# Patient Record
Sex: Female | Born: 1995 | Race: White | Hispanic: No | Marital: Single
Health system: Western US, Academic
[De-identification: ages and names within clinical notes are randomized; demographics above are authoritative.]

## PROBLEM LIST (undated history)

## (undated) DIAGNOSIS — N200 Calculus of kidney: Secondary | ICD-10-CM

## (undated) HISTORY — PX: SPINAL FUSION: SHX223

## (undated) HISTORY — PX: BREAST REDUCTION SURGERY: SHX8

---

## 2016-10-29 ENCOUNTER — Emergency Department
Admission: EM | Admit: 2016-10-29 | Discharge: 2016-10-29 | Disposition: A | Discharge status: Home / Self Care | Payer: BLUE CROSS/BLUE SHIELD | Attending: Emergency Medicine | Admitting: Emergency Medicine

## 2016-10-29 ENCOUNTER — Emergency Department: Payer: BLUE CROSS/BLUE SHIELD

## 2016-10-29 ENCOUNTER — Encounter: Payer: Self-pay | Admitting: Emergency Medicine

## 2016-10-29 DIAGNOSIS — R1032 Left lower quadrant pain: Secondary | ICD-10-CM | POA: Insufficient documentation

## 2016-10-29 DIAGNOSIS — R109 Unspecified abdominal pain: Secondary | ICD-10-CM

## 2016-10-29 HISTORY — DX: Calculus of kidney: N20.0

## 2016-10-29 LAB — BASIC METABOLIC PANEL
ANION GAP: 8 (ref 5–15)
BUN: 8 mg/dL (ref 6–20)
CALCIUM: 9.1 mg/dL (ref 8.9–10.3)
CO2: 25 mmol/L (ref 22–32)
Chloride: 105 mmol/L (ref 101–111)
Creatinine, Ser: 0.36 mg/dL — ABNORMAL LOW (ref 0.44–1.00)
GLUCOSE: 100 mg/dL — AB (ref 65–99)
POTASSIUM: 3.9 mmol/L (ref 3.5–5.1)
Sodium: 138 mmol/L (ref 135–145)

## 2016-10-29 LAB — URINALYSIS, COMPLETE (UACMP) WITH MICROSCOPIC
BACTERIA UA: NONE SEEN
BILIRUBIN URINE: NEGATIVE
Glucose, UA: NEGATIVE mg/dL
Hgb urine dipstick: NEGATIVE
KETONES UR: 5 mg/dL — AB
LEUKOCYTES UA: NEGATIVE
Nitrite: NEGATIVE
PROTEIN: NEGATIVE mg/dL
Specific Gravity, Urine: 1.017 (ref 1.005–1.030)
pH: 6 (ref 5.0–8.0)

## 2016-10-29 LAB — CBC
HEMATOCRIT: 39.9 % (ref 35.0–47.0)
Hemoglobin: 13.1 g/dL (ref 12.0–16.0)
MCH: 26.1 pg (ref 26.0–34.0)
MCHC: 32.7 g/dL (ref 32.0–36.0)
MCV: 79.8 fL — AB (ref 80.0–100.0)
PLATELETS: 372 10*3/uL (ref 150–440)
RBC: 5 MIL/uL (ref 3.80–5.20)
RDW: 14.7 % — AB (ref 11.5–14.5)
WBC: 11.2 10*3/uL — AB (ref 3.6–11.0)

## 2016-10-29 LAB — POCT PREGNANCY, URINE: Preg Test, Ur: NEGATIVE

## 2016-10-29 MED ORDER — DICYCLOMINE HCL 20 MG PO TABS: 20.0000 mg | ORAL_TABLET | Freq: Three times a day (TID) | ORAL | 0 refills | Status: AC | PRN

## 2016-10-29 MED ORDER — TRAMADOL HCL 50 MG PO TABS: 50.0000 mg | ORAL_TABLET | Freq: Four times a day (QID) | ORAL | 0 refills | Status: AC | PRN

## 2016-10-29 MED ORDER — OXYCODONE-ACETAMINOPHEN 5-325 MG PO TABS
2.0000 | ORAL_TABLET | Freq: Once | ORAL | Status: AC
  Administered 2016-10-29: 2 via ORAL

## 2016-10-29 MED ORDER — OXYCODONE-ACETAMINOPHEN 5-325 MG PO TABS
ORAL_TABLET | ORAL | Status: AC
  Administered 2016-10-29: 2 via ORAL
  Filled 2016-10-29: qty 2

## 2016-10-29 NOTE — ED Notes (Signed)
ED Provider at bedside. 

## 2016-10-29 NOTE — ED Provider Notes (Signed)
[facility] Emergency Department Provider Note       Time seen: ----------------------------------------- 8:17 PM on 10/29/2016 -----------------------------------------     I have reviewed the triage vital signs and the nursing notes.   HISTORY   Chief Complaint Back Pain    HPI Brittney Parks is a 21 y.o. female who presents to the ED for low back pain since Tuesday night. Patient is had left lower abdominal pain but has also had some pain in the right flank. Patient reports she has a history of kidney stones, denies fevers, chills, chest pain, shortness of breath, vomiting or diarrhea.   Past Medical History:  Diagnosis Date  . Kidney stones     There are no active problems to display for this patient.   Past Surgical History:  Procedure Laterality Date  . BREAST REDUCTION SURGERY    . SPINAL FUSION      Allergies Patient has no known allergies.  Social History Social History  Substance Use Topics  . Smoking status: Never Smoker  . Smokeless tobacco: Never Used  . Alcohol use No    Review of Systems Constitutional: Negative for fever. Cardiovascular: Negative for chest pain. Respiratory: Negative for shortness of breath. Gastrointestinal: Positive for flank pain Genitourinary: Negative for dysuria. Musculoskeletal: Negative for back pain. Skin: Negative for rash. Neurological: Negative for headaches, focal weakness or numbness.  10-point ROS otherwise negative.  ____________________________________________   PHYSICAL EXAM:  VITAL SIGNS: ED Triage Vitals [10/29/16 1915]  Enc Vitals Group     BP (!) 155/100     Pulse Rate 78     Resp 18     Temp 97.8 F (36.6 C)     Temp Source Oral     SpO2 99 %     Weight 180 lb (81.6 kg)     Height  (1.6 m)     Head Circumference      Peak Flow      Pain Score 5     Pain Loc      Pain Edu?      Excl. in GC?     Constitutional: Alert and oriented. Well appearing  and in no distress. Eyes: Conjunctivae are normal. PERRL. Normal extraocular movements. ENT   Head: Normocephalic and atraumatic.   Nose: No congestion/rhinnorhea.   Mouth/Throat: Mucous membranes are moist.   Neck: No stridor. Cardiovascular: Normal rate, regular rhythm. No murmurs, rubs, or gallops. Respiratory: Normal respiratory effort without tachypnea nor retractions. Breath sounds are clear and equal bilaterally. No wheezes/rales/rhonchi. Gastrointestinal: Soft and nontender. Normal bowel sounds Musculoskeletal: Nontender with normal range of motion in extremities. No lower extremity tenderness nor edema. Neurologic:  Normal speech and language. No gross focal neurologic deficits are appreciated.  Skin:  Skin is warm, dry and intact. No rash noted. Psychiatric: Mood and affect are normal. Speech and behavior are normal.  ____________________________________________  ED COURSE:  Pertinent labs & imaging results that were available during my care of the patient were reviewed by me and considered in my medical decision making (see chart for details). Patient presents for flank pain, we will assess with labs and imaging as indicated.   Procedures ____________________________________________   LABS (pertinent positives/negatives)  Labs Reviewed  URINALYSIS, COMPLETE (UACMP) WITH MICROSCOPIC - Abnormal; Notable for the following:       Result Value   Color, Urine YELLOW (*)    APPearance HAZY (*)    Ketones, ur 5 (*)    Squamous Epithelial /  LPF 0-5 (*)    All other components within normal limits  CBC - Abnormal; Notable for the following:    WBC 11.2 (*)    MCV 79.8 (*)    RDW 14.7 (*)    All other components within normal limits  BASIC METABOLIC PANEL - Abnormal; Notable for the following:    Glucose, Bld 100 (*)    Creatinine, Ser 0.36 (*)    All other components within normal limits  POCT PREGNANCY, URINE    RADIOLOGY Images were viewed by  me  KUB  IMPRESSION: 1. Nonobstructed gas pattern 2. Questionable punctate stones over the right kidney. IMPRESSION: 1. Bilateral nephrolithiasis without obstructive uropathy. 2. Hepatic steatosis. 3. Status post thoracolumbar fusion. No acute osseous appearing abnormality. ____________________________________________  FINAL ASSESSMENT AND PLAN  Flank pain  Plan: Patient's labs and imaging were dictated above. Patient had presented for unclear etiology for the patient's flank pain at this time. There is possible a gas or constipation component. She does not have extrarenal kidney stones. She is stable for outpatient follow-up.   [name], MD   Note: This note was generated in part or whole with voice recognition software. Voice recognition is usually quite accurate but there are transcription errors that can and very often do occur. I apologize for any typographical errors that were not detected and corrected.     [name], MD 10/29/16 2130

## 2016-10-29 NOTE — ED Triage Notes (Signed)
Patient ambulatory to triage with steady gait, without difficulty or distress noted; pt reports lower back pain since Tuesday pm, now with left lower abd pain; st hx kidney stones; denies any accomp symptoms

## 2016-11-04 ENCOUNTER — Encounter: Payer: Self-pay | Admitting: Emergency Medicine

## 2016-11-04 ENCOUNTER — Emergency Department: Payer: BLUE CROSS/BLUE SHIELD

## 2016-11-04 ENCOUNTER — Emergency Department
Admission: EM | Admit: 2016-11-04 | Discharge: 2016-11-04 | Disposition: A | Discharge status: Home / Self Care | Payer: BLUE CROSS/BLUE SHIELD | Attending: Emergency Medicine | Admitting: Emergency Medicine

## 2016-11-04 DIAGNOSIS — R109 Unspecified abdominal pain: Secondary | ICD-10-CM | POA: Diagnosis present

## 2016-11-04 DIAGNOSIS — N83202 Unspecified ovarian cyst, left side: Secondary | ICD-10-CM | POA: Diagnosis not present

## 2016-11-04 DIAGNOSIS — R102 Pelvic and perineal pain: Secondary | ICD-10-CM

## 2016-11-04 LAB — COMPREHENSIVE METABOLIC PANEL
ALBUMIN: 4.1 g/dL (ref 3.5–5.0)
ALT: 46 U/L (ref 14–54)
AST: 28 U/L (ref 15–41)
Alkaline Phosphatase: 43 U/L (ref 38–126)
Anion gap: 8 (ref 5–15)
BILIRUBIN TOTAL: 0.7 mg/dL (ref 0.3–1.2)
BUN: 11 mg/dL (ref 6–20)
CALCIUM: 9 mg/dL (ref 8.9–10.3)
CO2: 23 mmol/L (ref 22–32)
Chloride: 106 mmol/L (ref 101–111)
Creatinine, Ser: 0.58 mg/dL (ref 0.44–1.00)
GFR calc Af Amer: >60 mL/min (ref >60)
GLUCOSE: 93 mg/dL (ref 65–99)
POTASSIUM: 3.7 mmol/L (ref 3.5–5.1)
Sodium: 137 mmol/L (ref 135–145)
TOTAL PROTEIN: 7.9 g/dL (ref 6.5–8.1)

## 2016-11-04 LAB — URINALYSIS, COMPLETE (UACMP) WITH MICROSCOPIC
BILIRUBIN URINE: NEGATIVE
Bacteria, UA: NONE SEEN
Glucose, UA: NEGATIVE mg/dL
HGB URINE DIPSTICK: NEGATIVE
Ketones, ur: NEGATIVE mg/dL
NITRITE: NEGATIVE
PH: 6 (ref 5.0–8.0)
Protein, ur: NEGATIVE mg/dL
RBC / HPF: NONE SEEN RBC/hpf (ref 0–5)
SPECIFIC GRAVITY, URINE: 1.024 (ref 1.005–1.030)

## 2016-11-04 LAB — PREGNANCY, URINE: PREG TEST UR: NEGATIVE

## 2016-11-04 LAB — WET PREP, GENITAL
Clue Cells Wet Prep HPF POC: NONE SEEN
SPERM: NONE SEEN
TRICH WET PREP: NONE SEEN
Yeast Wet Prep HPF POC: NONE SEEN

## 2016-11-04 LAB — CBC
HEMATOCRIT: 38.7 % (ref 35.0–47.0)
Hemoglobin: 12.9 g/dL (ref 12.0–16.0)
MCH: 26.3 pg (ref 26.0–34.0)
MCHC: 33.4 g/dL (ref 32.0–36.0)
MCV: 78.7 fL — ABNORMAL LOW (ref 80.0–100.0)
Platelets: 335 10*3/uL (ref 150–440)
RBC: 4.92 MIL/uL (ref 3.80–5.20)
RDW: 14.7 % — AB (ref 11.5–14.5)
WBC: 10 10*3/uL (ref 3.6–11.0)

## 2016-11-04 LAB — LIPASE, BLOOD: LIPASE: 18 U/L (ref 11–51)

## 2016-11-04 LAB — CHLAMYDIA/NGC RT PCR (ARMC ONLY)
Chlamydia Tr: NOT DETECTED
N GONORRHOEAE: NOT DETECTED

## 2016-11-04 MED ORDER — DOCUSATE SODIUM 100 MG PO CAPS
100.0000 mg | ORAL_CAPSULE | Freq: Every day | ORAL | 2 refills | Status: AC | PRN
Start: 2016-11-04 — End: 2017-11-04

## 2016-11-04 MED ORDER — OXYCODONE-ACETAMINOPHEN 5-325 MG PO TABS
2.0000 | ORAL_TABLET | Freq: Once | ORAL | Status: AC
  Administered 2016-11-04: 2 via ORAL
  Filled 2016-11-04: qty 2

## 2016-11-04 MED ORDER — HYDROMORPHONE HCL 1 MG/ML IJ SOLN
1.0000 mg | Freq: Once | INTRAMUSCULAR | Status: AC
  Administered 2016-11-04: 1 mg via INTRAVENOUS
  Filled 2016-11-04: qty 1

## 2016-11-04 MED ORDER — IOPAMIDOL (ISOVUE-300) INJECTION 61%: 30.0000 mL | Freq: Once | INTRAVENOUS | Status: DC | PRN

## 2016-11-04 MED ORDER — OXYCODONE-ACETAMINOPHEN 7.5-325 MG PO TABS: 1.0000 | ORAL_TABLET | ORAL | 0 refills | Status: AC | PRN

## 2016-11-04 NOTE — ED Provider Notes (Signed)
[facility] Emergency Department Provider Note       Time seen: ----------------------------------------- 10:30 AM on 11/04/2016 -----------------------------------------     I have reviewed the triage vital signs and the nursing notes.   HISTORY   Chief Complaint Back Pain    HPI Brittney Parks is a 21 y.o. female who presents to the ED for left flank pain. Patient describes pain as sharp and pain on both sides of her back. She has a history of kidney stones, was seen here recently for same. She returns because the pain is now better and could not sleep last night. She also has diarrhea every time she eats. Symptoms started on Saturday.   Past Medical History:  Diagnosis Date  . Kidney stones     There are no active problems to display for this patient.   Past Surgical History:  Procedure Laterality Date  . BREAST REDUCTION SURGERY    . SPINAL FUSION      Allergies Patient has no known allergies.  Social History Social History  Substance Use Topics  . Smoking status: Never Smoker  . Smokeless tobacco: Never Used  . Alcohol use No    Review of Systems Constitutional: Negative for fever. Cardiovascular: Negative for chest pain. Respiratory: Negative for shortness of breath. Gastrointestinal: Positive for flank pain, diarrhea Genitourinary: Negative for dysuria. Musculoskeletal: Negative for back pain. Skin: Negative for rash. Neurological: Negative for headaches, focal weakness or numbness.  10-point ROS otherwise negative.  ____________________________________________   PHYSICAL EXAM:  VITAL SIGNS: ED Triage Vitals  Enc Vitals Group     BP 11/04/16 1008 136/77     Pulse Rate 11/04/16 1008 92     Resp 11/04/16 1008 16     Temp 11/04/16 1008 98.4 F (36.9 C)     Temp Source 11/04/16 1008 Oral     SpO2 11/04/16 1008 98 %     Weight 11/04/16 1009 180 lb (81.6 kg)     Height 11/04/16 1009  (1.6 m)     Head  Circumference --      Peak Flow --      Pain Score 11/04/16 1008 8     Pain Loc --      Pain Edu? --      Excl. in GC? --     Constitutional: Alert and oriented. Well appearing and in no distress. Eyes: Conjunctivae are normal. PERRL. Normal extraocular movements. ENT   Head: Normocephalic and atraumatic.   Nose: No congestion/rhinnorhea.   Mouth/Throat: Mucous membranes are moist.   Neck: No stridor. Cardiovascular: Normal rate, regular rhythm. No murmurs, rubs, or gallops. Respiratory: Normal respiratory effort without tachypnea nor retractions. Breath sounds are clear and equal bilaterally. No wheezes/rales/rhonchi. Gastrointestinal: Soft and nontender. Normal bowel sounds Genitourinary: Left adnexal tenderness, vaginal discharge is noted with some inflammation of the cervix Musculoskeletal: Nontender with normal range of motion in extremities. No lower extremity tenderness nor edema. Neurologic:  Normal speech and language. No gross focal neurologic deficits are appreciated.  Skin:  Skin is warm, dry and intact. No rash noted. ____________________________________________  ED COURSE:  Pertinent labs & imaging results that were available during my care of the patient were reviewed by me and considered in my medical decision making (see chart for details). Patient presents for flank pain, we will assess with labs and imaging as indicated.   Procedures ____________________________________________   LABS (pertinent positives/negatives)  Labs Reviewed  WET PREP, GENITAL - Abnormal; Notable for the following:  Result Value   WBC, Wet Prep HPF POC MANY (*)    All other components within normal limits  URINALYSIS, COMPLETE (UACMP) WITH MICROSCOPIC - Abnormal; Notable for the following:    Color, Urine YELLOW (*)    APPearance CLOUDY (*)    Leukocytes, UA TRACE (*)    Squamous Epithelial / LPF 6-30 (*)    All other components within normal limits  CBC -  Abnormal; Notable for the following:    MCV 78.7 (*)    RDW 14.7 (*)    All other components within normal limits  CHLAMYDIA/NGC RT PCR ([facility] ONLY)  PREGNANCY, URINE  COMPREHENSIVE METABOLIC PANEL  LIPASE, BLOOD  CBC WITH DIFFERENTIAL/PLATELET    RADIOLOGY Images were viewed by me  Pelvic ultrasound Reveals complex left ovarian structure measuring 3.2 x 3.9 x 3.3 cm likely indicating an ovarian cyst into which hemorrhage has occurred. Follow-up ultrasound in 8-12 weeks is recommended. Trace pelvic free fluid. ____________________________________________  FINAL ASSESSMENT AND PLAN  Flank pain, ovarian cyst  Plan: Patient's labs and imaging were dictated above. Patient had presented for flank pain which appears to be secondary to an ovarian cyst. She does have a GYN Dr. she's getting a follow-up with. She'll be discharged with pain medicine and advised to follow-up for repeat ultrasound.   [name], MD   Note: This note was generated in part or whole with voice recognition software. Voice recognition is usually quite accurate but there are transcription errors that can and very often do occur. I apologize for any typographical errors that were not detected and corrected.     [name], MD 11/04/16 1341

## 2016-11-04 NOTE — ED Triage Notes (Signed)
Patient returns with left flank pain, describes pain as "really really sharp" and that she has pain on both sides of her back.  Hx of renal calculi.  Seen here last week with similar sx.  Returns because pain is no better, could not sleep last PM.

## 2016-11-04 NOTE — ED Notes (Signed)
Patient also states she has diarrhea "every time she eats".  States this sx started on Sat.

## 2016-11-04 NOTE — ED Notes (Signed)
Lab called for recollect  

## 2017-09-15 ENCOUNTER — Other Ambulatory Visit: Payer: Self-pay | Admitting: Orthopedic Surgery

## 2017-09-15 DIAGNOSIS — M546 Pain in thoracic spine: Secondary | ICD-10-CM

## 2017-09-20 ENCOUNTER — Ambulatory Visit (HOSPITAL_COMMUNITY)
Admission: RE | Admit: 2017-09-20 | Discharge: 2017-09-20 | Disposition: A | Discharge status: Home / Self Care | Payer: BLUE CROSS/BLUE SHIELD | Source: Ambulatory Visit | Attending: Orthopedic Surgery | Admitting: Orthopedic Surgery

## 2017-09-20 DIAGNOSIS — Z96698 Presence of other orthopedic joint implants: Secondary | ICD-10-CM | POA: Insufficient documentation

## 2017-09-20 DIAGNOSIS — M41124 Adolescent idiopathic scoliosis, thoracic region: Secondary | ICD-10-CM | POA: Insufficient documentation

## 2017-09-20 DIAGNOSIS — Z9889 Other specified postprocedural states: Secondary | ICD-10-CM | POA: Insufficient documentation

## 2017-09-20 DIAGNOSIS — M546 Pain in thoracic spine: Secondary | ICD-10-CM

## 2017-10-12 ENCOUNTER — Ambulatory Visit: Payer: BLUE CROSS/BLUE SHIELD | Attending: Orthopedic Surgery | Admitting: Physical Therapy

## 2017-10-12 DIAGNOSIS — M62838 Other muscle spasm: Secondary | ICD-10-CM | POA: Insufficient documentation

## 2017-10-12 NOTE — Therapy (Signed)
[facility] PHYSICAL AND SPORTS MEDICINE [address] [place], [address] Phone: [phone]   Fax:  [phone]  Physical Therapy Evaluation  Patient Details  Name: Brittney Parks MRN: [id number] Date of Birth: 1995-12-29 Referring Provider: Theodis Aguas MD   Encounter Date: 10/12/2017  PT End of Session - 10/12/17 1539    Visit Number  1    Number of Visits  13    Date for PT Re-Evaluation  11/23/17    PT Start Time  0230    PT Stop Time  0330    PT Time Calculation (min)  60 min    Activity Tolerance  Patient tolerated treatment well;Patient limited by pain    Behavior During Therapy  [facility] for tasks assessed/performed       Past Medical History:  Diagnosis Date  . Kidney stones     Past Surgical History:  Procedure Laterality Date  . BREAST REDUCTION SURGERY    . SPINAL FUSION      There were no vitals filed for this visit.   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 She was seen 2/15 for pain along R shoulder blade with rash and swelling (patient points to R side of CT junction). Patient reports she saw Dr. Francee Gentile who did a CT scan to confrim no infection process and reports "where is no evidence of a significant problem on her current CT scan. I do not see any septations fluid collection or clear mass or prominence". Patient reports the rash spontaneously subsided 2 weeks ago , and reports there is less swelling, but reports her pain is continuing in this area. She reports pain is the nature of a muscle spasm with a "jumping sensation" localized to the area R of the CT junction near shoulder blade. 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  Patient reports her pain does not awaken her in the middle of the night, but that it is difficult to get to sleep at night. She reports tramadol is helping her sleep. She reports worst pain in the past week is: 6/10 best is" 0/10. Patient reports she will be following up with Mishew  and her PCP at the end of May    Limitations  Lifting;House hold activities    How long can you sit comfortably?  unlimited    How long can you stand comfortably?  unlimited    How long can you walk comfortably?  unlimited    Diagnostic tests  CT scan 2/25: unremarkable    Patient Stated Goals  decrease pain and muscle spasms    Currently in Pain?  Yes    Pain Score  0-No pain    Pain Location  Thoracic    Pain Orientation  Posterior;Right;Mid    Pain Descriptors / Indicators  Spasm;Tingling;Cramping;Contraction    Pain Type  Acute pain    Pain Radiating Towards  none    Pain Onset  1 to 4 weeks ago    Pain Frequency  Intermittent    Aggravating Factors   quick turning, attempting to sleep,     Pain Relieving Factors  medication,     Effect of Pain on Daily Activities  disturbed sleep, unable to interact with students    Multiple Pain Sites  No         [facility] PT Assessment - 10/12/17 0001      Assessment   Medical Diagnosis  idiopathic adolescent scolosis    Referring Provider  Mishew MD    Onset Date/Surgical Date  02/11/10    Hand Dominance  Right    Next MD Visit  -- Not scheduled at this time    Prior Therapy  -- Yes; scolosis      Balance Screen   Has the patient fallen in the past 6 months  No    Has the patient had a decrease in activity level because of a fear of falling?   No    Is the patient reluctant to leave their home because of a fear of falling?   No      Home Public house manager residence    Home Access  Stairs to enter      Prior Function   Level of Independence  Independent    Vocation  Student    Vocation Requirements  prolonged sitting;    Leisure   -- Systems analyst- recreation with students (Kindergarten)      Sensation   Light Touch  -- Reports R side at T5-7 area has been deminished since 2011sx         Palpation No tenderness noted at CTJ, but patient reports sometimes this area has sharp pain tenderness- on observation area looks swollen, almost with fluid. Slight TTP at T4 area paraspinals and very TTP with muscle spasm at R rhomboid. Insupine with bilat shoulders abd 90/90 patient reports stretch and is slightly TTP at bilat pec insertions   AROM All shoulder AROM motions wnl w/o pain All Cervical AROM motions wnl w/o pain All thoracic motions wnl with thoracic flexion and flexion + L rotation combination causing "stetch sensation" in the area of pain   PROM All shoulder and cervical AROM motions wnl w/o pain Increased stretch sensation on R thoracic musculature with PT overpressure with thoracic flexion    Strength Shoulder gross 5/5 bilat Grip Strength 4/5 bilat Rhomboid 5/5 bilat with pain on R localized to R rhomboid (pt points Upper Trap R: 3+/5 L 4/5 Middle Trap R: 3/5 L 4/5 Lower Trap R: 3/5 L 4/5 *Reports R side trap strength testing is much more difficult  Supine deep cervical flexor chin tuck + lift can hold for 13 secs without compensation   Special Tests/Other (-) Spurlings    Posture Patient sits/stands w/ upper crossed posture (forward head + rounded shoulders) that she is unable to correct without TC and VC from PT     Objective measurements completed on examination: See above findings.              PT Education - 10/12/17 1531    Education provided  Yes    Education Details  Patient was educated on diagnosis, anatomy and pathology involved, prognosis, role of PT, and was given an HEP, demonstrating exercise with proper form following verbal and tactile cues, and was given a paper hand out to continue exercise at home. Pt was educated on and agreed to plan of care.    Person(s)  Educated  Patient    Methods  Demonstration;Explanation;Tactile cues;Verbal cues;Handout    Comprehension  Verbal cues required;Tactile cues required;Returned demonstration;Verbalized understanding       PT Short Term Goals - 10/12/17 1553  PT SHORT TERM GOAL #1   Title  Pt will be independent with HEP in order to improve strength and flexibility and improve function at home and work.    Time  2    Period  Weeks    Status  New        PT Long Term Goals - 10/12/17 1554      PT LONG TERM GOAL #1   Title  Patient will increase FOTO score to 69 to demonstrate predicted increase in functional mobility to complete ADLs    Baseline  3/19: 56    Time  6    Period  Weeks    Status  New      PT LONG TERM GOAL #2   Title  Pt will decrease mODI scoreby at least 13 points in order demonstrate clinically significant reduction in pain/disability    Baseline  3/19 20%    Time  6    Period  Weeks             Plan - 10/12/17 1548    Clinical Impression Statement   Pt is a 22 year-old female with pain at CT junction and pain with muscle spasm at R rhomboid/paraspinal, without radiation, with insidious onset, worsening over the past 4 weeks. 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 Current activity limitations in sleeping, making quick trunk rotation movements, and with lifting. Impairments include decreased truncal ROM, soft tissue restrictions (esp at bilat pec and R rhomboid), scapular muscle guarding, decreased postural muscle strengthening/lengthening relationship (FHRS posture), and thoracic pain. 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 Pt will benefit from skilled PT intervention to address the aforementioned impairments and activity limitation for best  return to PLOF    History and Personal Factors relevant to plan of care:  2 personal factors/comorbidities, 3 body systems/activity limitations/participation restrictions     Clinical Presentation  Evolving    Clinical Presentation due to:  Objective tests and measures    Clinical Decision Making  Moderate    Rehab Potential  Good    Clinical Impairments Affecting Rehab Potential  (-) sedentary lifestyle, prior surgery, multiple pain sites (+) young age, acute nature of symptoms    PT Frequency  2x / week    PT Duration  6 weeks    PT Treatment/Interventions  Manual techniques;Dry needling;Passive range of motion;Neuromuscular re-education;Functional mobility training;Cryotherapy;Moist Heat;Electrical Stimulation;Iontophoresis 4mg /ml Dexamethasone;Therapeutic exercise;Therapeutic activities;Patient/family education;Taping    PT Next Visit Plan  HEP review; manual techniques as tolerated for muscle spasms, posture strengthening/lengthening     PT Home Exercise Plan  doorway pec stretch, seated rhomboid stretch, modified child's pose L side bias    Consulted and Agree with Plan of Care  Patient       Patient will benefit from skilled therapeutic intervention in order to improve the following deficits and impairments:  Decreased mobility, Increased muscle spasms, Impaired tone, Postural dysfunction, Improper body mechanics, Pain, Increased fascial restricitons, Decreased endurance, Decreased range of motion, Decreased strength, Hypomobility, Impaired flexibility  Visit Diagnosis: Other muscle spasm     Problem List There are no active problems to display for this patient.  Staci Acosta PT, DPT  Staci Acosta 10/12/2017, 4:00 PM  [facility] PHYSICAL AND SPORTS MEDICINE [address]  [place], [place], [id number] Phone: [phone]   Fax:  [phone]  Name: Brittney Parks MRN: [id number] Date of Birth: 03-28-1996

## 2017-10-14 ENCOUNTER — Encounter: Payer: Self-pay | Admitting: Physical Therapy

## 2017-10-14 ENCOUNTER — Ambulatory Visit: Payer: BLUE CROSS/BLUE SHIELD | Admitting: Physical Therapy

## 2017-10-14 DIAGNOSIS — M62838 Other muscle spasm: Secondary | ICD-10-CM | POA: Diagnosis not present

## 2017-10-14 NOTE — Therapy (Addendum)
[place] [facility] PHYS[facility] SPORTS MEDICINE [address] [place], [address] Phone: [phone]   Fax:  [phone]  Physical Therapy Treatment  Patient Details  Name: Brittney Parks MRN: [id number] Date of Birth: 25-Jul-1996 Referring Provider: Theodis Aguas MD   Encounter Date: 10/14/2017  PT End of Session - 10/19/17 1718    Visit Number  3    Number of Visits  13    Date for PT Re-Evaluation  11/23/17    PT Start Time  0445    PT Stop Time  0525    PT Time Calculation (min)  40 min    Activity Tolerance  Patient tolerated treatment well;Patient limited by pain    Behavior During Therapy  [facility] for tasks assessed/performed       Past Medical History:  Diagnosis Date  . Kidney stones     Past Surgical History:  Procedure Laterality Date  . BREAST REDUCTION SURGERY    . SPINAL FUSION      There were no vitals filed for this visit.  Subjective Assessment - 10/19/17 1654    Subjective  Patient reports following ESTIM last session she had 0/10 pain for 24hours. Patient reports that yesterday and today she has had 6/10 pain that she reports is worse earlier in the day and later in the afternoon, at CTJ and R shoulder blade. Patient reports some compliance with HEP    Pertinent History  Patient is a 22 year old student presenting today with thoracic pain. 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 She was seen 2/15 for pain along R shoulder blade with rash and swelling (patient points to R side of CT junction). Patient reports she saw Dr. Francee Gentile who did a CT scan to confrim no infection process and reports "where is no evidence of a significant problem on her current CT scan. I do not see any septations fluid collection or clear mass or prominence". Patient reports the rash spontaneously subsided 2 weeks ago , and reports there is less swelling, but reports her pain is continuing in this area. She reports pain is the  nature of a muscle spasm with a "jumping sensation" localized to the area R of the CT junction near shoulder blade. 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 Patient reports her pain does not awaken her in the middle of the night, but that it is difficult to get to sleep at night. She reports tramadol is helping her sleep. She reports worst pain in the past week is: 6/10 best is" 0/10. Patient reports she will be following up with Mishew  and her PCP at the end of May    Limitations  Lifting;House hold activities    How long can you sit comfortably?  unlimited    How long can you stand comfortably?  unlimited    How long can you walk comfortably?  unlimited    Diagnostic tests  CT scan 2/25: unremarkable    Patient Stated Goals  decrease pain and muscle spasms    Pain Onset  1 to 4 weeks ago          Manual - STM and trigger point release to R t spine paraspinals, rhomboid with patient very tender in these areas that subsided 50% with continued manual therapy - With patient supine on 1/2 half roller STM and trigger point  release to pec minor with cross friction massage to prox tendon insertion  Ther-Ex -Child's pose on theraball forward R and L with increased discomfort and patient with less motion with L bias -Neutral postural holds 3x 30sec with PT cuing for scapular retraction and decreasing bilat shoulder hiking  NeuroMuscular Re-Ed ESTIM + heat pack HiVolt for pain modulation and muscle spasm reduction at patient tolerated intensity of 50V with therapist monitoring intensity throughout treatment and assessing patient skin integrity following (normal). During ESTIM treatment PT used pictures and analogies to educate patient on pain science and hypersensitivity, and the brain-body connection as it deals with pain response. Therapist utilized multiple modes of explanation and  patient verbalized understanding of concept and agreed to continue movement as much as possible, address maladaptive thoughts about pain, and to use touch to stimulate mechanoreceptors to attempt to desensitize painful areas.    paraphrase                      PT Education - 10/19/17 1717    Education provided  Yes    Education Details  Exercise form    Person(s) Educated  Patient    Methods  Explanation;Demonstration;Tactile cues;Verbal cues    Comprehension  Verbalized understanding;Returned demonstration;Verbal cues required       PT Short Term Goals - 10/12/17 1553      PT SHORT TERM GOAL #1   Title  Pt will be independent with HEP in order to improve strength and flexibility and improve function at home and work.    Time  2    Period  Weeks    Status  New        PT Long Term Goals - 10/12/17 1554      PT LONG TERM GOAL #1   Title  Patient will increase FOTO score to 69 to demonstrate predicted increase in functional mobility to complete ADLs    Baseline  3/19: 56    Time  6    Period  Weeks    Status  New      PT LONG TERM GOAL #2   Title  Pt will decrease mODI scoreby at least 13 points in order demonstrate clinically significant reduction in pain/disability    Baseline  3/19 20%    Time  6    Period  Weeks      PT LONG TERM GOAL #3   Title  Pt will decrease worst pain as reported on NPRS by at least 3 points in order to demonstrate clinically significant reduction in pain.    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 ESTIM was used again d/t success with previous treatment. Following ESTIM and manual therapy patient reports decreased pain to 3/10. Patient admits she has a lot of personal stress that she believes is contibuting to her pain. PT educated patient on breath control techniques as well as stress reduction/muscle tension reduction therex. Patient reports, and PT observes that the "swelling" sensation and observable knot to the right of her CTJ has greatly reduced. Patient reports she will be gone to an interview this Thursday, but will continue her HEP and follow up with PT as scheduled    Clinical Impairments Affecting Rehab Potential  (-) sedentary lifestyle, prior surgery, multiple pain sites (+) young age, acute nature of symptoms    PT Frequency  2x / week    PT Duration  6 weeks    PT Treatment/Interventions  Manual techniques;Dry needling;Passive range of motion;Neuromuscular re-education;Functional mobility training;Cryotherapy;Moist Heat;Electrical Stimulation;Iontophoresis 4mg /ml Dexamethasone;Therapeutic exercise;Therapeutic activities;Patient/family education;Taping    PT Next Visit Plan  manual techniques as tolerated for muscle spasms, posture strengthening/lengthening     PT Home Exercise Plan  levator stretch, UT stretch; doorway pec stretch, seated rhomboid stretch, modified child's pose L side bias    Consulted and Agree with Plan of Care  Patient       Patient will benefit from skilled therapeutic intervention in order to improve the following deficits and impairments:   Decreased mobility, Increased muscle spasms, Impaired tone, Postural dysfunction, Improper body mechanics, Pain, Increased fascial restricitons, Decreased endurance, Decreased range of motion, Decreased strength, Hypomobility, Impaired flexibility  Visit Diagnosis: Other muscle spasm     Problem List There are no active problems to display for this patient.  Staci Acostahelsea Miller PT, DPT Staci Acostahelsea Miller 10/19/2017, 5:42 PM   [facility] [address] [place], [place], [address] Phone: [phone]   Fax:  [phone]  Name: Brittney Parks MRN: [id number] Date of Birth: [birth year]

## 2017-10-18 ENCOUNTER — Ambulatory Visit: Payer: BLUE CROSS/BLUE SHIELD | Admitting: Physical Therapy

## 2017-10-19 ENCOUNTER — Ambulatory Visit: Payer: BLUE CROSS/BLUE SHIELD | Admitting: Physical Therapy

## 2017-10-19 ENCOUNTER — Encounter: Payer: Self-pay | Admitting: Physical Therapy

## 2017-10-19 DIAGNOSIS — M62838 Other muscle spasm: Secondary | ICD-10-CM

## 2017-10-19 NOTE — Therapy (Signed)
[facility] [place] [facility] [address] [place], [place], [address] Phone: [phone]   Fax:  [phone]  Physical Therapy Treatment  Patient Details  Name: Brittney DeemDanielle Parks MRN: [id number] Date of Birth: March 05, 1996 Referring Provider: Theodis AguasMishew MD   Encounter Date: 10/19/2017    Past Medical History:  Diagnosis Date  . Kidney stones     Past Surgical History:  Procedure Laterality Date  . BREAST REDUCTION SURGERY    . SPINAL FUSION      There were no vitals filed for this visit.      NeuroMuscular Re-Ed ESTIM + heat pack 14mins HiVolt for pain modulation and muscle spasm reduction at patient tolerated intensity of55V with therapist monitoring intensity throughout treatment and assessing patient skin integrity following (normal). During ESTIM treatment PT used pictures and analogies to educate patient on pain science and hypersensitivity, and the brain-body connection as it deals with pain response. Therapist utilized multiple modes of explanation and patient verbalized understanding of concept and agreed to continue movement as much as possible, address maladaptive thoughts about pain, and to use touch to stimulate mechanoreceptors to attempt to desensitize painful areas.    Manual - STM and trigger point release to R levator, UT,  t spine paraspinals, rhomboid with patient still tender with increased pressure, but much less than prior session. Noted less tension and pain reduction from patient following.    Ther-Ex -Levator Stretch 30sec hold 3x each side (added to HEP) -Cat/Cow inhale/exhale focusing on breath control in available range of spine motion -Education on stress and muscular tightness and techniques such as breath control for stress reduction and decreasing muscular tension    pain following 3/10   No data recorded                 PT Short Term Goals - 10/12/17 1553      PT SHORT  TERM GOAL #1   Title  Pt will be independent with HEP in order to improve strength and flexibility and improve function at home and work.    Time  2    Period  Weeks    Status  New        PT Long Term Goals - 10/12/17 1554      PT LONG TERM GOAL #1   Title  Patient will increase FOTO score to 69 to demonstrate predicted increase in functional mobility to complete ADLs    Baseline  3/19: 56    Time  6    Period  Weeks    Status  New      PT LONG TERM GOAL #2   Title  Pt will decrease mODI scoreby at least 13 points in order demonstrate clinically significant reduction in pain/disability    Baseline  3/19 20%    Time  6    Period  Weeks      PT LONG TERM GOAL #3   Title  Pt will decrease worst pain as reported on NPRS by at least 3 points in order to demonstrate clinically significant reduction in pain.    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     Problem List There are no active problems to display for this patient.  Staci Acosta PT, DPT Staci Acosta 10/19/2017, 4:49 PM  [facility] PHYSICAL AND SPORTS  MEDICINE [address] [place], [address] Phone: [phone]   Fax:  [phone]  Name: Brittney Parks MRN: [id number] Date of Birth: 06/10/1996

## 2017-10-21 ENCOUNTER — Ambulatory Visit: Payer: BLUE CROSS/BLUE SHIELD | Admitting: Physical Therapy

## 2017-10-26 ENCOUNTER — Encounter: Payer: Self-pay | Admitting: Physical Therapy

## 2017-10-26 ENCOUNTER — Ambulatory Visit: Payer: BLUE CROSS/BLUE SHIELD | Attending: Orthopedic Surgery | Admitting: Physical Therapy

## 2017-10-26 DIAGNOSIS — M62838 Other muscle spasm: Secondary | ICD-10-CM | POA: Diagnosis present

## 2017-10-26 NOTE — Therapy (Signed)
[facility] PHYS[facility] SPORTS MEDICINE [address] [place], [address] Phone: [phone]   Fax:  [phone]  Physical Therapy Treatment  Patient Details  Name: Brittney Parks MRN: [id number] Date of Birth: Feb 04, 1996 Referring Provider: Theodis Aguas MD   Encounter Date: 10/26/2017  PT End of Session - 10/26/17 1653    Visit Number  4    Number of Visits  13    Date for PT Re-Evaluation  11/23/17    PT Start Time  0415    PT Stop Time  0500    PT Time Calculation (min)  45 min    Activity Tolerance  Patient tolerated treatment well;Patient limited by pain    Behavior During Therapy  [facility] for tasks assessed/performed       Past Medical History:  Diagnosis Date  . Kidney stones     Past Surgical History:  Procedure Laterality Date  . BREAST REDUCTION SURGERY    . SPINAL FUSION      There were no vitals filed for this visit.  Subjective Assessment - 10/26/17 1616    Subjective  Patient reports she took a red eye back from [facility] night which increased her pain. Patient reports she has been unable to perform her stretches while she was gone, and that when she returned her stretches "hurt and she feels like she tweeked something. Patient reports that following ESTIM she was able to fall asleep without pain for the first time in a while. Patient reports she is not having as much pain around the shoulder blades, that it is more along the upper neck.     Pertinent History  Patient is a 22 year old student presenting today with thoracic pain. 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 She was seen 2/15 for pain along R shoulder blade with rash and swelling (patient points to R side of CT junction). Patient reports she saw Dr. Francee Gentile who did a CT scan to confrim no infection process and reports "where is no evidence of a significant problem on her current CT scan. I do not see any septations fluid collection or  clear mass or prominence". Patient reports the rash spontaneously subsided 2 weeks ago , and reports there is less swelling, but reports her pain is continuing in this area. She reports pain is the nature of a muscle spasm with a "jumping sensation" localized to the area R of the CT junction near shoulder blade. 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 Patient reports her pain does not awaken her in the middle of the night, but that it is difficult to get to sleep at night. She reports tramadol is helping her sleep. She reports worst pain in the past week is: 6/10 best is" 0/10. Patient reports she will be following up with Mishew  and her PCP at the end of May    Limitations  Lifting;House hold activities    How long can you sit comfortably?  unlimited    How long can you stand comfortably?  unlimited    How long can you walk comfortably?  unlimited    Diagnostic tests  CT scan 2/25: unremarkable    Patient Stated Goals  decrease pain and muscle spasms    Pain Onset  1 to 4 weeks ago       NeuroMuscular Re-Ed ESTIM +  heat pack 15mins HiVolt for pain modulation and muscle spasm reduction at patient tolerated intensity of65V with therapist monitoring intensity throughout treatment and assessing patient skin integrity following (normal). Therapist educated patient on the importance of cervical stretching to maintain modality gains   Manual - STM and trigger point bilat UT, levator, occipitals, and C spine paraspinals; assessed tspine paraspinals and rhomboids with minimal tightness noted and patient reporting no pain or current concordant sign -Cervical traction 10sec traction 10sec release x 8; adding lower cervical ext 6 bouts; then with lower cervical ext + upper cervical flex 6 bouts -C0-5 UPA grade I-II 30sec bouts 4 bouts each segment  Ther-Ex -Suboccipital stretch 2x 30sec (pic in  patient instruction) -Education on importance of levator, UT, and suboccipital stretch to maintain decreased pain gains from manual therapy and modality treatment .                        PT Education - 10/26/17 1620    Education provided  Yes    Education Details  exercise form    Person(s) Educated  Patient    Methods  Explanation;Demonstration;Tactile cues;Verbal cues    Comprehension  Verbal cues required;Tactile cues required;Returned demonstration;Verbalized understanding       PT Short Term Goals - 10/12/17 1553      PT SHORT TERM GOAL #1   Title  Pt will be independent with HEP in order to improve strength and flexibility and improve function at home and work.    Time  2    Period  Weeks    Status  New        PT Long Term Goals - 10/12/17 1554      PT LONG TERM GOAL #1   Title  Patient will increase FOTO score to 69 to demonstrate predicted increase in functional mobility to complete ADLs    Baseline  3/19: 56    Time  6    Period  Weeks    Status  New      PT LONG TERM GOAL #2   Title  Pt will decrease mODI scoreby at least 13 points in order demonstrate clinically significant reduction in pain/disability    Baseline  3/19 20%    Time  6    Period  Weeks      PT LONG TERM GOAL #3   Title  Pt will decrease worst pain as reported on NPRS by at least 3 points in order to demonstrate clinically significant reduction in pain.    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 Patient has increased tenderness and trigger points in suboccipitals, and reports this pain is similar to the "headache" sensation she reveals comes on most nights. Patient describes a tension headache distribution. Patient benefitted from last session ESTIM, reporting she was able to get to sleep well for the first time, so PT attempted this today at the suboccipital/UT area (chief compliant) which patient reported relieved pain. PT encouraged patient to focus on cervical stretching to maintain gains from manula/modality treatment and to decrease tension headache symptoms. Patient continues to have a palpable mass at CTJ that does not seem muscular in nature, but rather reveals tight musculature and trigger points underneath. Patient maintains that mass  came on all of a sudden, CT reports no fluid in area, but mass no longer has rash associated per patient report.     Rehab Potential  Good    Clinical Impairments Affecting Rehab Potential  (-) sedentary lifestyle, prior surgery, multiple pain sites (+) young age, acute nature of symptoms    PT Frequency  2x / week    PT Duration  6 weeks    PT Treatment/Interventions  Manual techniques;Dry needling;Passive range of motion;Neuromuscular re-education;Functional mobility training;Cryotherapy;Moist Heat;Electrical Stimulation;Iontophoresis 4mg /ml Dexamethasone;Therapeutic exercise;Therapeutic activities;Patient/family education;Taping    PT Next Visit Plan  manual techniques as tolerated for muscle spasms, posture strengthening/lengthening     PT Home Exercise Plan  suoccipital stretch (added 4/2(, levator stretch, UT stretch;  doorway pec stretch, seated rhomboid stretch, modified child's pose L side bias    Consulted and Agree with Plan of Care  Patient       Patient will benefit from skilled therapeutic intervention in order to improve the following deficits and impairments:  Decreased mobility, Increased muscle spasms, Impaired tone, Postural dysfunction, Improper body mechanics, Pain, Increased fascial restricitons, Decreased endurance, Decreased range of motion, Decreased strength, Hypomobility, Impaired flexibility  Visit Diagnosis: Other muscle spasm     Problem List There are no active problems to display for this patient.  Staci Acosta PT, DPT Staci Acosta 10/26/2017, 5:03 PM   [facility] PHYSICAL AND SPORTS MEDICINE [address] [place], [address] Phone: [phone]   Fax:  [phone]  Name: Breahna Boylen MRN: [id number] Date of Birth: 29-Dec-1995

## 2017-10-28 ENCOUNTER — Ambulatory Visit: Payer: BLUE CROSS/BLUE SHIELD | Admitting: Physical Therapy

## 2017-10-28 ENCOUNTER — Encounter: Payer: Self-pay | Admitting: Physical Therapy

## 2017-10-28 DIAGNOSIS — M62838 Other muscle spasm: Secondary | ICD-10-CM | POA: Diagnosis not present

## 2017-10-28 NOTE — Therapy (Signed)
[place] [facility]ICINE [address] [place], [place], [address] Phone: [phone]   Fax:  [phone]  Physical Therapy Treatment  Patient Details  Name: Brittney DeemDanielle Parks MRN: [id number] Date of Birth: September 23, 1995 Referring Provider: Theodis AguasMishew MD   Encounter Date: 10/28/2017  PT End of Session - 10/28/17 1722    Visit Number  5    Number of Visits  13    Date for PT Re-Evaluation  11/23/17    PT Start Time  0445    PT Stop Time  0530    PT Time Calculation (min)  45 min    Activity Tolerance  Patient tolerated treatment well;Patient limited by pain    Behavior During Therapy  [facility] for tasks assessed/performed       Past Medical History:  Diagnosis Date  . Kidney stones     Past Surgical History:  Procedure Laterality Date  . BREAST REDUCTION SURGERY    . SPINAL FUSION      There were no vitals filed for this visit.  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 Patient reports following last session she had pain relief for 24 hours.     Pertinent History  Patient is a 22 year old student presenting today with thoracic pain. 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 She was seen 2/15 for pain along R shoulder blade with rash and swelling (patient points to R side of CT junction). Patient reports she saw Dr. Francee GentileMinsew who did a CT scan to confrim no infection process and reports "where is no evidence of a significant problem on her current CT scan. I do not see any septations fluid collection or clear mass or prominence". Patient reports the rash spontaneously subsided 2 weeks ago , and reports there is less swelling, but reports her pain is continuing in this area. She reports pain is the nature of a muscle spasm with a "jumping sensation" localized to the area R  of the CT junction near shoulder blade. 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 Patient reports her pain does not awaken her in the middle of the night, but that it is difficult to get to sleep at night. She reports tramadol is helping her sleep. She reports worst pain in the past week is: 6/10 best is" 0/10. Patient reports she will be following up with Mishew  and her PCP at the end of May    Limitations  Lifting;House hold activities    How long can you sit comfortably?  unlimited    How long can you stand comfortably?  unlimited    How long can you walk comfortably?  unlimited    Diagnostic tests  CT scan 2/25: unremarkable    Patient Stated Goals  decrease pain and muscle spasms    Pain Onset  1 to 4 weeks ago           NeuroMuscular Re-Ed ESTIM + heat pack 15mins HiVolt for pain modulation and muscle spasm reduction at patient tolerated intensity of95V, increasing to 105Vwith therapist monitoring intensity throughout treatment and assessing patient skin integrity following (normal). Therapist educated patient on the importance of cervical stretching to maintain modality gains  Manual -  STM and trigger point bilat  levator, occipitals, and C spine paraspinals; assessed tspine paraspinals and R periscapular musculature. STM and trigger point release to L trap as this area was much less sensitive than R trap -(4) 60mm .25 needles placed along the R trap to decrease increased muscular spasms and trigger points with the patient positioned in supine. Patient was educated on risks and benefits of therapy and verbally consents to PT. Dry needling was attempted as pt is very tender at this area and unable to tolerate prolonged manual techniques. Following patient reports she felt "less tightness" and pain in this area.  -C0-7 AP mobs grade I-II 30sec bouts 4 bouts each  segment    .                      PT Education - 10/28/17 1721    Education provided  Yes    Education Details  dry needling education; reinforcing HEP to maintain increased muscle length gains made in PT session    Person(s) Educated  Patient    Methods  Explanation    Comprehension  Verbalized understanding       PT Short Term Goals - 10/12/17 1553      PT SHORT TERM GOAL #1   Title  Pt will be independent with HEP in order to improve strength and flexibility and improve function at home and work.    Time  2    Period  Weeks    Status  New        PT Long Term Goals - 10/12/17 1554      PT LONG TERM GOAL #1   Title  Patient will increase FOTO score to 69 to demonstrate predicted increase in functional mobility to complete ADLs    Baseline  3/19: 56    Time  6    Period  Weeks    Status  New      PT LONG TERM GOAL #2   Title  Pt will decrease mODI scoreby at least 13 points in order demonstrate clinically significant reduction in pain/disability    Baseline  3/19 20%    Time  6    Period  Weeks      PT LONG TERM GOAL #3   Title  Pt will decrease worst pain as reported on NPRS by at least 3 points in order to demonstrate clinically significant reduction in pain.    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 Patient reports that she experiences pain relief following PT, but that relief lasts only 24 hours. Pt does reveal that she is not 100% diligent with HEP and PT encourgaed patient to complete stretching to maintain gains made in PT. Patient reports no pain and has 75% decreased trigger points/tightness following manaul + modality treatment.     Clinical Impairments Affecting Rehab Potential  (-) sedentary lifestyle, prior surgery, multiple pain sites (+) young age, acute nature of symptoms    PT Frequency  2x / week    PT Duration  6 weeks    PT Treatment/Interventions  Manual techniques;Dry needling;Passive range of motion;Neuromuscular re-education;Functional mobility training;Cryotherapy;Moist Heat;Electrical Stimulation;Iontophoresis 4mg /ml Dexamethasone;Therapeutic exercise;Therapeutic activities;Patient/family education;Taping    PT Next Visit Plan  manual techniques as tolerated for muscle spasms, posture strengthening/lengthening     PT Home Exercise Plan  suoccipital stretch (added 4/2(, levator stretch, UT stretch; doorway pec stretch, seated rhomboid stretch, modified child's pose L side bias    Consulted and Agree with Plan of Care  Patient       Patient will benefit from skilled therapeutic intervention in order to improve the following deficits and impairments:  Decreased mobility, Increased muscle spasms, Impaired tone, Postural dysfunction, Improper body mechanics, Pain, Increased fascial restricitons, Decreased endurance, Decreased range of motion, Decreased strength, Hypomobility, Impaired flexibility  Visit  Diagnosis: Other muscle spasm     Problem List There are no active problems to display for this patient.  Staci Acosta PT, DPT Staci Acosta 10/28/2017, 5:29 PM  [place] [facility] PH[facility]ICAL AND SPORTS MEDICINE [address], [place], [address] Phone: [phone]   Fax:  [phone]  Name: Brittney Parks MRN: [id number] Date of Birth: 08-04-1995

## 2017-11-02 ENCOUNTER — Encounter: Payer: Self-pay | Admitting: Physical Therapy

## 2017-11-02 ENCOUNTER — Ambulatory Visit: Payer: BLUE CROSS/BLUE SHIELD | Admitting: Physical Therapy

## 2017-11-02 DIAGNOSIS — M62838 Other muscle spasm: Secondary | ICD-10-CM

## 2017-11-02 NOTE — Therapy (Signed)
[facility] [address] [place], [place], [address] Phone: [phone]   Fax:  [phone]  Physical Therapy Treatment  Patient Details  Name: Brittney Parks MRN: [id number] Date of Birth: 1996/04/09 Referring Provider: Theodis AguasMishew MD   Encounter Date: 11/02/2017  PT End of Session - 11/02/17 1627    Visit Number  6    Number of Visits  13    Date for PT Re-Evaluation  11/23/17    PT Start Time  0345    PT Stop Time  0430    PT Time Calculation (min)  45 min    Activity Tolerance  Patient tolerated treatment well;Patient limited by pain    Behavior During Therapy  [facility] for tasks assessed/performed       Past Medical History:  Diagnosis Date  . Kidney stones     Past Surgical History:  Procedure Laterality Date  . BREAST REDUCTION SURGERY    . SPINAL FUSION      There were no vitals filed for this visit.  Subjective Assessment - 11/02/17 1550    Subjective  Patient reports good pain relief following last session she has experienced good pain relief everywhere (CTJ junction, bilat UT, bilat rhomboids) until now, EXCEPT medial R shoulder blade pain which returned Sunday evening with stress with lesson planning. Patient reports compliance with her HEP. She reports she is still experiencing tension HA at 7pm with tenderness to palpation at bilat occipitals. Patient's chief complaint today is R peri-scapular musculature which she reports is 8/10 pain    Pertinent History  Patient is a 22 year old student presenting today with thoracic pain. 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 She was seen 2/15 for pain along R shoulder blade with rash and swelling (patient points to R side of CT junction). Patient reports she saw Dr. Francee GentileMinsew who did a CT scan to confrim no infection process and reports "where is no evidence of a significant problem on her current CT scan. I do not see any septations fluid  collection or clear mass or prominence". Patient reports the rash spontaneously subsided 2 weeks ago , and reports there is less swelling, but reports her pain is continuing in this area. She reports pain is the nature of a muscle spasm with a "jumping sensation" localized to the area R of the CT junction near shoulder blade. 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 Patient reports her pain does not awaken her in the middle of the night, but that it is difficult to get to sleep at night. She reports tramadol is helping her sleep. She reports worst pain in the past week is: 6/10 best is" 0/10. Patient reports she will be following up with Mishew  and her PCP at the end of May    Limitations  Lifting;House hold activities    How long can you sit comfortably?  unlimited    How long can you stand comfortably?  unlimited    How long can you walk comfortably?  unlimited    Diagnostic tests  CT scan 2/25: unremarkable    Patient Stated Goals  decrease pain and muscle spasms    Pain Onset  1 to 4 weeks ago       Manual -Prolonged STM and trigger point release to R supraspinatous, levator scapulae, and rhomboid major/minor with  noted tension release, able to increase palpation pressure throughout -Assessed R UT and sub-occipital musculature with patient reporting minimal pain here and no noted trigger points following maintaining stretching post last dry needling + manual/ESTIM treatment -STM and trigger point release to L UT and sub-occipital with patient decreasing tenderness to palpation through manual techniques     ESTIM + heat pack HiVolt ESTIM 15 min at patient tolerated 65V increased to 75V through treatment at R periscapular area . Attempted d/t success of treatment at previous session success. With PT assessing patient tolerance throughout (decreasing intensity as needed), monitoring skin  integrity (normal), with decreased pain noted from patient                         PT Education - 11/02/17 1626    Education provided  Yes    Education Details  HEP review and encouragement, stress management techniques    Person(s) Educated  Patient    Methods  Explanation    Comprehension  Verbalized understanding;Verbal cues required       PT Short Term Goals - 10/12/17 1553      PT SHORT TERM GOAL #1   Title  Pt will be independent with HEP in order to improve strength and flexibility and improve function at home and work.    Time  2    Period  Weeks    Status  New        PT Long Term Goals - 10/12/17 1554      PT LONG TERM GOAL #1   Title  Patient will increase FOTO score to 69 to demonstrate predicted increase in functional mobility to complete ADLs    Baseline  3/19: 56    Time  6    Period  Weeks    Status  New      PT LONG TERM GOAL #2   Title  Pt will decrease mODI scoreby at least 13 points in order demonstrate clinically significant reduction in pain/disability    Baseline  3/19 20%    Time  6    Period  Weeks      PT LONG TERM GOAL #3   Title  Pt will decrease worst pain as reported on NPRS by at least 3 points in order to demonstrate clinically significant reduction in pain.    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 Patient's L UT and sub occipital area remains very tight with noted trigger points. As PT is able, PT will try to work scheduling out to needle L side as well. Patient reports following R UT dry needling, she was able to maintain progress through stretching at home and now her headaches are mostly on the L side. Patient is still very tender to the L UT and sub-occipital area with noted tightness that 50% resolved following manual techniques. patient's chief complaint today is R peri-scapular musculature, which PT spend prolonged time with manual techniques and utilized ESTIM. Following session patient reports 0/10 pain    Clinical Impairments Affecting Rehab Potential  (-) sedentary lifestyle, prior surgery, multiple pain sites (+) young age, acute nature of symptoms    PT Frequency  2x / week    PT Duration  6 weeks    PT Treatment/Interventions  Manual techniques;Dry needling;Passive range of motion;Neuromuscular re-education;Functional mobility training;Cryotherapy;Moist Heat;Electrical Stimulation;Iontophoresis 4mg /ml Dexamethasone;Therapeutic exercise;Therapeutic activities;Patient/family education;Taping    PT Next Visit Plan  manual techniques as tolerated for muscle spasms, posture strengthening/lengthening     PT Home Exercise Plan  suoccipital stretch (added 4/2(, levator stretch, UT stretch; doorway pec stretch, seated rhomboid stretch, modified child's pose L side bias    Consulted and Agree with Plan of Care  Patient       Patient will benefit from skilled therapeutic intervention in order to improve the following deficits and impairments:  Decreased mobility, Increased muscle spasms, Impaired tone, Postural dysfunction, Improper body mechanics, Pain, Increased  fascial restricitons, Decreased endurance, Decreased range of motion, Decreased strength, Hypomobility, Impaired flexibility  Visit Diagnosis: Other muscle spasm     Problem List There are no active problems to display for this patient.  Staci Acosta PT, DPT Staci Acosta 11/02/2017, 7:50 PM  [facility] PHYSICAL AND SPORTS MEDICINE [address], [place], [address] Phone: [phone]   Fax:  [phone]  Name: Brittney Parks MRN: [id number] Date of Birth: January 06, 1996

## 2017-11-04 ENCOUNTER — Encounter: Payer: Self-pay | Admitting: Physical Therapy

## 2017-11-04 ENCOUNTER — Ambulatory Visit: Payer: BLUE CROSS/BLUE SHIELD | Admitting: Physical Therapy

## 2017-11-04 DIAGNOSIS — M62838 Other muscle spasm: Secondary | ICD-10-CM

## 2017-11-04 NOTE — Therapy (Signed)
[facility]ICINE [address] [place], [place], [address] Phone: [phone]   Fax:  [phone]  Physical Therapy Treatment  Patient Details  Name: Brittney Parks MRN: [id number] Date of Birth: 1995-08-31 Referring Provider: Theodis AguasMishew MD   Encounter Date: 11/04/2017  PT End of Session - 11/04/17 1622    Visit Number  7    Number of Visits  13    Date for PT Re-Evaluation  11/23/17    PT Start Time  0345    PT Stop Time  0430    PT Time Calculation (min)  45 min    Activity Tolerance  Patient tolerated treatment well;Patient limited by pain    Behavior During Therapy  [facility] for tasks assessed/performed       Past Medical History:  Diagnosis Date  . Kidney stones     Past Surgical History:  Procedure Laterality Date  . BREAST REDUCTION SURGERY    . SPINAL FUSION      There were no vitals filed for this visit.  Subjective Assessment - 11/04/17 1547    Subjective  Patient reports she is still having headaches every night, but is only having spasms at the R shoulder blade. Patient reports compliance with her HEP.     Pertinent History  Patient is a 22 year old student presenting today with thoracic pain. 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 She was seen 2/15 for pain along R shoulder blade with rash and swelling (patient points to R side of CT junction). Patient reports she saw Dr. Francee GentileMinsew who did a CT scan to confrim no infection process and reports "where is no evidence of a significant problem on her current CT scan. I do not see any septations fluid collection or clear mass or prominence". Patient reports the rash spontaneously subsided 2 weeks ago , and reports there is less swelling, but reports her pain is continuing in this area. She reports pain is the nature of a muscle spasm with a "jumping sensation" localized to the area R of the CT junction near shoulder blade. 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 Patient reports her pain does not awaken her in the middle of the night, but that it is difficult to get to sleep at night. She reports tramadol is helping her sleep. She reports worst pain in the past week is: 6/10 best is" 0/10. Patient reports she will be following up with Mishew  and her PCP at the end of May    Limitations  Lifting;House hold activities    How long can you sit comfortably?  unlimited    How long can you stand comfortably?  unlimited    How long can you walk comfortably?  unlimited    Pain Onset  1 to 4 weeks ago       Manua -STM and trigger point release to L levator (most time spend here per patient complain and noted trigger points and excess tension), bilat infraspinatus, and bilat sub-occipitals (R>L) (4) 60mm .25 needles placed along the L suboccipital to decrease increased muscular spasms and trigger points with the patient positioned in prone. Patient was educated on risks and benefits of therapy and verbally consents to PT.       ESTIM + heat pack HiVolt ESTIM 15 min at patient tolerated 85V increased to 105V  through treatment at R levator/infraspinatus area . Attempted d/t success of treatment at previous session success. With PT assessing patient tolerance throughout (increasing intensity as needed), monitoring skin integrity (normal), with decreased pain noted from patient                        PT Education - 11/04/17 1549    Education provided  Yes    Education Details  Sleeper stretch for infraspinatus tension; tension headache education    Person(s) Educated  Patient    Methods  Explanation;Demonstration;Verbal cues;Handout    Comprehension  Returned demonstration;Verbalized understanding;Verbal cues required       PT Short Term Goals - 10/12/17 1553      PT SHORT TERM GOAL #1   Title  Pt will be  independent with HEP in order to improve strength and flexibility and improve function at home and work.    Time  2    Period  Weeks    Status  New        PT Long Term Goals - 10/12/17 1554      PT LONG TERM GOAL #1   Title  Patient will increase FOTO score to 69 to demonstrate predicted increase in functional mobility to complete ADLs    Baseline  3/19: 56    Time  6    Period  Weeks    Status  New      PT LONG TERM GOAL #2   Title  Pt will decrease mODI scoreby at least 13 points in order demonstrate clinically significant reduction in pain/disability    Baseline  3/19 20%    Time  6    Period  Weeks      PT LONG TERM GOAL #3   Title  Pt will decrease worst pain as reported on NPRS by at least 3 points in order to demonstrate clinically significant reduction in pain.    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 Patient's chief complaint and most palpable trigger point is in L levator/infraspinatus. Patient continues to report subjective tension HA and noted tightness in (L>R) sub occipitals. Patient is very tender in the area as well. PT attempted dry needing to L  subocciptal d/t prior success with UT dry needling for  tension relief. Patient was advised to continue stretching (adding sleeper stretch today) to maintain muscle lengthening obtaining during PT session.    Rehab Potential  Good    Clinical Impairments Affecting Rehab Potential  (-) sedentary lifestyle, prior surgery, multiple pain sites (+) young age, acute nature of symptoms    PT Frequency  2x / week    PT Duration  6 weeks    PT Treatment/Interventions  Manual techniques;Dry needling;Passive range of motion;Neuromuscular re-education;Functional mobility training;Cryotherapy;Moist Heat;Electrical Stimulation;Iontophoresis 4mg /ml Dexamethasone;Therapeutic exercise;Therapeutic activities;Patient/family education;Taping    PT Next Visit Plan  manual techniques as tolerated for muscle spasms, posture strengthening/lengthening     PT Home Exercise Plan  suoccipital stretch (added 4/2(, levator stretch, UT stretch; doorway pec stretch, seated rhomboid stretch, modified child's pose L side bias    Consulted and Agree with Plan of Care  Patient       Patient will benefit from skilled therapeutic intervention in order to improve the following deficits and impairments:  Decreased mobility, Increased muscle spasms, Impaired tone, Postural dysfunction, Improper body mechanics, Pain, Increased fascial restricitons, Decreased endurance, Decreased range of motion, Decreased strength, Hypomobility, Impaired flexibility  Visit Diagnosis: Other muscle spasm     Problem List There are no active problems to display for this patient.  Staci Acosta PT, DPT Staci Acosta 11/04/2017, 4:37 PM  [facility] PHYSICAL AND SPORTS MEDICINE [address] [place], [address] Phone: [phone]   Fax:  [phone]  Name: Brittney Parks MRN: [id number] Date of Birth: Feb 06, 1996

## 2017-11-09 ENCOUNTER — Encounter: Payer: Self-pay | Admitting: Physical Therapy

## 2017-11-09 ENCOUNTER — Ambulatory Visit: Payer: BLUE CROSS/BLUE SHIELD | Admitting: Physical Therapy

## 2017-11-09 DIAGNOSIS — M62838 Other muscle spasm: Secondary | ICD-10-CM | POA: Diagnosis not present

## 2017-11-09 NOTE — Therapy (Signed)
[place] [facility] [address], [place], [address] Phone: [phone]   Fax:  [phone]  Physical Therapy Treatment  Patient Details  Name: Lailana Shira MRN: [id number] Date of Birth: Jan 26, 1996 Referring Provider: Theodis Aguas MD   Encounter Date: 11/09/2017  PT End of Session - 11/10/17 1805    Visit Number  8    Number of Visits  13    Date for PT Re-Evaluation  11/23/17    PT Start Time  0409    PT Stop Time  0454    PT Time Calculation (min)  45 min    Activity Tolerance  Patient tolerated treatment well;Patient limited by pain    Behavior During Therapy  [facility] for tasks assessed/performed       Past Medical History:  Diagnosis Date  . Kidney stones     Past Surgical History:  Procedure Laterality Date  . BREAST REDUCTION SURGERY    . SPINAL FUSION      There were no vitals filed for this visit.  Subjective Assessment - 11/09/17 1615    Subjective  Patient reports she feels as though her pain is getting worse, which she thinks is because she is "so stressed". She points to bilat UT for pain distribution which she says is 5/10, but that today on the bus ride back from her feild trip today it was a 7/10. She reports compliance with her HEP.     Pertinent History  Patient is a 22 year old student presenting today with thoracic pain. 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 She was seen 2/15 for pain along R shoulder blade with rash and swelling (patient points to R side of CT junction). Patient reports she saw Dr. Francee Gentile who did a CT scan to confrim no infection process and reports "where is no evidence of a significant problem on her current CT scan. I do not see any septations fluid collection or clear mass or prominence". Patient reports the rash spontaneously subsided 2 weeks ago , and reports there is less swelling, but reports her pain is continuing in this area. She reports  pain is the nature of a muscle spasm with a "jumping sensation" localized to the area R of the CT junction near shoulder blade. 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 Patient reports her pain does not awaken her in the middle of the night, but that it is difficult to get to sleep at night. She reports tramadol is helping her sleep. She reports worst pain in the past week is: 6/10 best is" 0/10. Patient reports she will be following up with Mishew  and her PCP at the end of May    Limitations  Lifting;House hold activities    How long can you sit comfortably?  unlimited    How long can you stand comfortably?  unlimited    How long can you walk comfortably?  unlimited    Diagnostic tests  CT scan 2/25: unremarkable    Pain Onset  1 to 4 weeks ago      Manual -Cervical distraction 10sec distraction/10sec relax x10 bouts -Inferior scapular mobs GI-II 30sec bouts 6 bouts each side -STM to bilat UT and levator with most focus spend on L as this is patients chief pain complaint -trigger point release to bilat sub occipitals with more time  spent L>R and STM + trigger point release to bilat upper cervical paraspinals L>R    ESTIM+ heat packHiVolt ESTIM15 min at patient tolerated95Vincreased to115V through treatment and L UT area. Attempted d/t success of treatment at previous session success. With PT assessing patient tolerance throughout (increasing intensity as needed), monitoring skin integrity (normal), with decreased pain noted from patient                            PT Education - 11/10/17 1803    Education provided  Yes    Education Details  Education on the effect of stress on pain and possible stress reduction/anxiety reduction techniques (yoga, mindfullness, talk therapy, meditation)    Person(s) Educated  Patient    Methods   Explanation;Demonstration;Verbal cues    Comprehension  Verbal cues required;Returned demonstration;Verbalized understanding       PT Short Term Goals - 10/12/17 1553      PT SHORT TERM GOAL #1   Title  Pt will be independent with HEP in order to improve strength and flexibility and improve function at home and work.    Time  2    Period  Weeks    Status  New        PT Long Term Goals - 10/12/17 1554      PT LONG TERM GOAL #1   Title  Patient will increase FOTO score to 69 to demonstrate predicted increase in functional mobility to complete ADLs    Baseline  3/19: 56    Time  6    Period  Weeks    Status  New      PT LONG TERM GOAL #2   Title  Pt will decrease mODI scoreby at least 13 points in order demonstrate clinically significant reduction in pain/disability    Baseline  3/19 20%    Time  6    Period  Weeks      PT LONG TERM GOAL #3   Title  Pt will decrease worst pain as reported on NPRS by at least 3 points in order to demonstrate clinically significant reduction in pain.    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  Staci Acostahelsea Miller PT, DPT Staci Acostahelsea Miller 11/10/2017, 6:13 PM  [facility] [address] [place], [place], [address] Phone: [phone]   Fax:  [phone]  Name: Alinda DeemDanielle Dorer MRN: [id number] Date of Birth: 11-17-1995

## 2017-11-11 ENCOUNTER — Ambulatory Visit: Payer: BLUE CROSS/BLUE SHIELD | Admitting: Physical Therapy

## 2017-11-11 ENCOUNTER — Encounter: Payer: Self-pay | Admitting: Physical Therapy

## 2017-11-11 DIAGNOSIS — M62838 Other muscle spasm: Secondary | ICD-10-CM | POA: Diagnosis not present

## 2017-11-11 NOTE — Therapy (Signed)
[facility]IC[facility] [address], [place], [address] Phone: [phone]   Fax:  [phone]  Physical Therapy Treatment  Patient Details  Name: Brittney Parks MRN: [id number] Date of Birth: May 25, 1996 Referring Provider: Theodis Aguas MD   Encounter Date: 11/11/2017  PT End of Session - 11/11/17 1635    Visit Number  9    Number of Visits  13    Date for PT Re-Evaluation  11/23/17    PT Start Time  0400    PT Stop Time  0441    PT Time Calculation (min)  41 min    Activity Tolerance  Patient tolerated treatment well;Patient limited by pain    Behavior During Therapy  [facility] for tasks assessed/performed       Past Medical History:  Diagnosis Date  . Kidney stones     Past Surgical History:  Procedure Laterality Date  . BREAST REDUCTION SURGERY    . SPINAL FUSION      There were no vitals filed for this visit.  Subjective Assessment - 11/11/17 1602    Subjective  Patient comes in today with tension headache and pain on each side of the neck. Patient reports she is having no shoulder pain today, which she is happy about. She is going home tomorrow for SB which she thinks will help to decrease her stress and pain.     Pertinent History  Patient is a 22 year old student presenting today with thoracic pain. 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 She was seen 2/15 for pain along R shoulder blade with rash and swelling (patient points to R side of CT junction). Patient reports she saw Dr. Francee Gentile who did a CT scan to confrim no infection process and reports "where is no evidence of a significant problem on her current CT scan. I do not see any septations fluid collection or clear mass or prominence". Patient reports the rash spontaneously subsided 2 weeks ago , and reports there is less swelling, but reports her pain is continuing in this area. She reports pain is the nature of a muscle spasm with  a "jumping sensation" localized to the area R of the CT junction near shoulder blade. 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 Patient reports her pain does not awaken her in the middle of the night, but that it is difficult to get to sleep at night. She reports tramadol is helping her sleep. She reports worst pain in the past week is: 6/10 best is" 0/10. Patient reports she will be following up with Mishew  and her PCP at the end of May    Limitations  Lifting;House hold activities    How long can you sit comfortably?  unlimited    How long can you stand comfortably?  unlimited    How long can you walk comfortably?  unlimited    Diagnostic tests  CT scan 2/25: unremarkable    Patient Stated Goals  decrease pain and muscle spasms    Pain Onset  1 to 4 weeks ago           Manual -STM and trigger point release to bilat UT L>R -STM and trigger point release to bilat suboccipital L>R *prolonged time spent here* with decreased TTP and 75% tissue tension release following -Manual cervical traction 10sec distraction 10sec relax  x10 -C0-1 mob I-II 30sec bouts 8 bout CPA; 8 bout R UPA; 8 bout L UPA    ESTIM + heat pack HiVolt ESTIM 15 min at patient tolerated 70V increased to 80V through treatment at L UT/sub occipital area . Attempted d/t success of treatment at previous session success. With PT assessing patient tolerance throughout (increasing intensity as needed), monitoring skin integrity (normal), with decreased pain noted from patient                       PT Education - 11/11/17 1629    Education provided  Yes    Education Details  Education on cervical trigger points in relation to tension HA pain pattern.     Person(s) Educated  Patient    Methods  Explanation;Demonstration;Verbal cues    Comprehension  Verbal cues required;Verbalized understanding       PT  Short Term Goals - 10/12/17 1553      PT SHORT TERM GOAL #1   Title  Pt will be independent with HEP in order to improve strength and flexibility and improve function at home and work.    Time  2    Period  Weeks    Status  New        PT Long Term Goals - 10/12/17 1554      PT LONG TERM GOAL #1   Title  Patient will increase FOTO score to 69 to demonstrate predicted increase in functional mobility to complete ADLs    Baseline  3/19: 56    Time  6    Period  Weeks    Status  New      PT LONG TERM GOAL #2   Title  Pt will decrease mODI scoreby at least 13 points in order demonstrate clinically significant reduction in pain/disability    Baseline  3/19 20%    Time  6    Period  Weeks      PT LONG TERM GOAL #3   Title  Pt will decrease worst pain as reported on NPRS by at least 3 points in order to demonstrate clinically significant reduction in pain.    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 Patient came in with tension headache 6/10 pain that she reported came from the "base of  skull up the back of her head and into both eyes". With manual techniques patient reported headache subsided  and 0/10 pain. PT encouraged patient to continue stretching in HEP, with focus on suboccipital stretches to prevent tension HA/stop tension HA when it comes on. Patient was educated on cervical trigger points as they relate to HA pain distribution.     Rehab Potential  Good    Clinical Impairments Affecting Rehab Potential  (-) sedentary lifestyle, prior surgery, multiple pain sites (+) young age, acute nature of symptoms    PT Frequency  2x / week    PT Duration  6 weeks    PT Treatment/Interventions  Manual techniques;Dry needling;Passive range of motion;Neuromuscular re-education;Functional mobility training;Cryotherapy;Moist Heat;Electrical Stimulation;Iontophoresis 4mg /ml Dexamethasone;Therapeutic exercise;Therapeutic activities;Patient/family education;Taping    PT Next Visit Plan  manual techniques as tolerated for muscle spasms, posture strengthening/lengthening     PT Home Exercise Plan  suoccipital stretch (added 4/2(, levator stretch, UT stretch; doorway pec stretch, seated rhomboid stretch, modified child's pose L side bias    Consulted and Agree with Plan of Care  Patient       Patient will benefit from skilled therapeutic intervention in order to improve the following deficits and impairments:  Decreased mobility, Increased muscle spasms, Impaired tone, Postural dysfunction, Improper body mechanics, Pain, Increased fascial restricitons, Decreased endurance, Decreased range of motion, Decreased strength, Hypomobility, Impaired flexibility  Visit Diagnosis: Other muscle spasm     Problem List There are no active problems to display for this patient.  Staci Acosta PT, DPT Staci Acosta 11/11/2017, 4:57 PM  [place] [facility] PHYSICAL AND SPORTS MEDICINE [address], [place], [address] Phone: [phone]   Fax:  [phone]  Name: Brittney Parks MRN: [id number] Date of Birth: [birth year]

## 2017-11-16 ENCOUNTER — Ambulatory Visit: Payer: BLUE CROSS/BLUE SHIELD | Admitting: Physical Therapy

## 2017-11-23 ENCOUNTER — Encounter: Payer: BLUE CROSS/BLUE SHIELD | Admitting: Physical Therapy

## 2017-11-25 ENCOUNTER — Ambulatory Visit: Payer: BLUE CROSS/BLUE SHIELD | Attending: Orthopedic Surgery | Admitting: Physical Therapy

## 2017-11-25 ENCOUNTER — Encounter: Payer: Self-pay | Admitting: Physical Therapy

## 2017-11-25 DIAGNOSIS — M62838 Other muscle spasm: Secondary | ICD-10-CM | POA: Diagnosis present

## 2017-11-25 NOTE — Therapy (Signed)
Brittney Parks [facility]ICINE [address] [place], [address] Phone: [phone]   Fax:  [phone]  Physical Therapy Treatment  Patient Details  Name: Brittney Parks MRN: [id number] Date of Birth: February 12, 1996 Referring Provider: Theodis Aguas MD   Encounter Date: 11/25/2017  PT End of Session - 11/25/17 1614    Visit Number  10    Number of Visits  13    Date for PT Re-Evaluation  11/23/17    PT Start Time  0400    PT Stop Time  0445    PT Time Calculation (min)  45 min    Activity Tolerance  Patient tolerated treatment well;Patient limited by pain    Behavior During Therapy  [facility] for tasks assessed/performed       Past Medical History:  Diagnosis Date  . Kidney stones     Past Surgical History:  Procedure Laterality Date  . BREAST REDUCTION SURGERY    . SPINAL FUSION      There were no vitals filed for this visit.  Subjective Assessment - 11/25/17 1607    Subjective  Patient reports her pain much better when she was home for vacation (as she had more time to relax and complete HEP stretches), and came back Friday before she returned Saturday following a job interview Friday. 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 Patient also reports she had no tension HA at home (in [place] with her dogs in her "own bed") as well and that they began over the weekend as well. Patient is noting (with PT) that her neck pain is very stressed induced. Patient is returning home in 2 weeks and thinks her pain will be better after that and does not want to schedule more appts at this time, reporting she has a lot to do before graduation and will schedule with her home PT in [place]. Pt reports she thinks the manual + ESTIM is giving her the most pain relief at this time and would lke to continue that today has her pain is 7/10    Pertinent History  Patient is a 22 year old student  presenting today with thoracic pain. 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 She was seen 2/15 for pain along R shoulder blade with rash and swelling (patient points to R side of CT junction). Patient reports she saw Dr. Francee Gentile who did a CT scan to confrim no infection process and reports "where is no evidence of a significant problem on her current CT scan. I do not see any septations fluid collection or clear mass or prominence". Patient reports the rash spontaneously subsided 2 weeks ago , and reports there is less swelling, but reports her pain is continuing in this area. She reports pain is the nature of a muscle spasm with a "jumping sensation" localized to the area R of the CT junction near shoulder blade. 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 Patient reports her pain does not awaken her in the middle of the night, but that it is difficult to get to sleep at night. She reports tramadol is helping her sleep. She reports worst pain in the past week is: 6/10 best is" 0/10. Patient reports she will be following up with [facility]  and her PCP at the end of May  Limitations  Lifting;House hold activities    How long can you sit comfortably?  unlimited    How long can you stand comfortably?  unlimited    How long can you walk comfortably?  unlimited    Diagnostic tests  CT scan 2/25: unremarkable    Patient Stated Goals  decrease pain and muscle spasms    Pain Onset  1 to 4 weeks ago         Manual -Cervical traction 10sec traction 10sec relax x10 bouts -STM + trigger point release to bilat suboccipitals (prolonged time spend on L SO as this is most tender with most tightness, and concordant pain sign produced from patient), and bilat temporalis muscle as patient reports she has some "headache pain here" -STM and trigger point release to bilat UT/Levator- quickly  discontinued d/t decreased tension and no pain noted from patient    ESTIM + heat pack HiVolt ESTIM 15 min at patient tolerated 160V increased to 170V through treatment at bilat suboccipital area . Attempted d/t success of treatment at previous session success. With PT assessing patient tolerance throughout (decreasing intensity as needed), monitoring skin integrity (normal), with decreased pain noted from patient. Utilized this time to complete FOTO survey and to educate patient on continuing PT and talk therapy for stress management at home as cervical pain is congruent with life stressors and reinforced breath control, stretching, and mindfulness practice for stress management, and the effect of stress and tension on pain.                           PT Short Term Goals - 10/12/17 1553      PT SHORT TERM GOAL #1   Title  Pt will be independent with HEP in order to improve strength and flexibility and improve function at home and work.    Time  2    Period  Weeks    Status  New        PT Long Term Goals - 10/12/17 1554      PT LONG TERM GOAL #1   Title  Patient will increase FOTO score to 69 to demonstrate predicted increase in functional mobility to complete ADLs    Baseline  3/19: 56    Time  6    Period  Weeks    Status  New      PT LONG TERM GOAL #2   Title  Pt will decrease mODI scoreby at least 13 points in order demonstrate clinically significant reduction in pain/disability    Baseline  3/19 20%    Time  6    Period  Weeks      PT LONG TERM GOAL #3   Title  Pt will decrease worst pain as reported on NPRS by at least 3 points in order to demonstrate clinically significant reduction in pain.    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     Problem List There are no active problems to display for this patient.   Staci Acosta 11/25/2017, 4:17 PM  [facility] PHYSICAL AND SPORTS MEDICINE [address] [place], [address] Phone: [phone]   Fax:  [phone]  Name: Brittney Parks MRN: [id number] Date of Birth: 06-15-1996

## 2017-12-08 ENCOUNTER — Ambulatory Visit: Payer: BLUE CROSS/BLUE SHIELD | Admitting: Physical Therapy

## 2017-12-14 ENCOUNTER — Encounter: Payer: Self-pay | Admitting: Physical Therapy

## 2017-12-14 ENCOUNTER — Ambulatory Visit: Payer: BLUE CROSS/BLUE SHIELD | Admitting: Physical Therapy

## 2017-12-14 DIAGNOSIS — M62838 Other muscle spasm: Secondary | ICD-10-CM

## 2017-12-14 NOTE — Therapy (Signed)
[facility] PH[facility]ORTS MEDICINE [address] [place], [address] Phone: [phone]   Fax:  [phone]  Physical Therapy Treatment  Patient Details  Name: Brittney Parks MRN: [id number] Date of Birth: 1995-07-31 Referring Provider: Theodis Aguas MD   Encounter Date: 12/14/2017  PT End of Session - 12/14/17 0938    Visit Number  12    Number of Visits  17    Date for PT Re-Evaluation  12/21/17    PT Start Time  0930    PT Stop Time  1015    PT Time Calculation (min)  45 min    Activity Tolerance  Patient tolerated treatment well;Patient limited by pain    Behavior During Therapy  [facility] for tasks assessed/performed       Past Medical History:  Diagnosis Date  . Kidney stones     Past Surgical History:  Procedure Laterality Date  . BREAST REDUCTION SURGERY    . SPINAL FUSION      There were no vitals filed for this visit.  Subjective Assessment - 12/14/17 0931    Subjective  Patient reports last week her grandfather passed away last week and she had to fly home for 24hrs and slept on the floor. 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 Patient also reports she has been sleeping on her carpet as she has sold all the furniture in her appt to prepare for her move back home. She reports she was also having mid back pain with neck pain last week, but localizing pain to neck and R shoulder today. Patient rates her pain here 7/10. Patient reports she has been deligently doing her stretches, which she thinks is helping to keep her pain.     Pertinent History  Patient is a 22 year old student presenting today with thoracic pain. 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 She was seen 2/15 for pain along R shoulder blade with rash and swelling (patient  points to R side of CT junction). Patient reports she saw Dr. Francee Gentile who did a CT scan to confrim no infection process and reports "where is no evidence of a significant problem on her current CT scan. I do not see any septations fluid collection or clear mass or prominence". Patient reports the rash spontaneously subsided 2 weeks ago , and reports there is less swelling, but reports her pain is continuing in this area. She reports pain is the nature of a muscle spasm with a "jumping sensation" localized to the area R of the CT junction near shoulder blade. 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 Patient reports her pain does not awaken her in the middle of the night, but that it is difficult to get to sleep at night. She reports tramadol is helping her sleep. She reports worst pain in the past week is: 6/10 best is" 0/10. Patient reports she will be following up with [facility]  and her PCP at the end of May    Limitations  Lifting;House hold activities    How long can you sit comfortably?  unlimited    How long can you stand comfortably?  unlimited    How long can you walk comfortably?  unlimited    Diagnostic tests  CT scan 2/25: unremarkable    Patient Stated Goals  decrease pain and muscle spasms    Pain Onset  1 to 4 weeks ago            Manual -Cervical traction 10sec traction 10sec relax x10 bouts -STM + trigger point release to bilat suboccipitals (prolonged time spent here as this is patient's chief pain complaint and increased tenderness noted bilat -STM and trigger point release to bilat UT/Levator- discontinued on L side quickly as there were min trigger points that were easily released, more time spent on R side as patient reports pain here with increased trigger points noted   ESTIM+ heat packHiVolt ESTIM12 min at patient tolerated105Vincreased to115V through treatmentat  bilat suboccipitalarea. Attempted d/t success of treatment at previous session success. With PT assessing patient tolerance throughout (decreasing intensity as needed), monitoring skin integrity (normal), with decreased pain noted from patient. Utilized this time to educate patient on sleep wellness- limiting blue light before bed, limiting noise, meditation and stretching before bed, and decreasing stimulus before bed    Ther-Ex -Reviewed HEP stretching with patient to ensure proper form -Reviewed the importance of mental health/stress maintenance for stress reduction -Postural education and this effect on spine health/pain with PT demonstrating and pt following demonstration of: Seated neutral rows, seated high rows, supine chin tuck + lift 10sec hold and printed these for patient with education on rep/set/frequency.                   PT Education - 12/14/17 [phone]    Education provided  Yes    Education Details  Postural education; D/C reccommendations    Person(s) Educated  Patient    Methods  Explanation;Verbal cues;Demonstration    Comprehension  Verbal cues required;Verbalized understanding;Returned demonstration       PT Short Term Goals - 11/25/17 1636      PT SHORT TERM GOAL #1   Title  Pt will be independent with HEP in order to improve strength and flexibility and improve function at home and work.    Time  2    Period  Weeks    Status  Achieved        PT Long Term Goals - 11/25/17 1637      PT LONG TERM GOAL #1   Title  Patient will increase FOTO score to 69 to demonstrate predicted increase in functional mobility to complete ADLs    Baseline  11/25/17 87    Time  6    Period  Weeks    Status  Achieved      PT LONG TERM GOAL #2   Title  Pt will decrease mODI scoreby at least 13 points in order demonstrate clinically significant reduction in pain/disability    Baseline  11/25/17 8%    Time  6    Period  Weeks    Status  Achieved      PT LONG TERM  GOAL #3   Title  Pt will decrease worst pain as reported on NPRS by at least 3 points in order to demonstrate clinically significant reduction in pain.    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 D/t increased pain from increased stress over the past week, PT utilized pain reduction techniques this session with patient reporting no pain in cervical spine following, which she was very pleased with. PT also made D/C reccommendations for postural muscle strengthening to establish a neutral length/tension relationship of the cervical and t spine musculature. Pt demonstrated therex with 100% accuracy and was given a handout as well. PT encouraged patient to continue talk therapy for stress management at home, and continue PT as needed when she returns to [place]. Patient reports that she feels as though she has made great strides, and is soley having stress related tension headache (she has intermittent back and neck pain that she is able to resolve with stretching and modalities. PT reviewed all stretches as well and gave patient PT's email to reach out should she have any questions/concerns.    Clinical Impairments Affecting Rehab Potential  (-)  sedentary lifestyle, prior surgery, multiple pain sites (+) young age, acute nature of symptoms    PT Frequency  2x / week    PT Duration  6 weeks    PT Treatment/Interventions  Manual techniques;Dry needling;Passive range of motion;Neuromuscular re-education;Functional mobility training;Cryotherapy;Moist Heat;Electrical Stimulation;Iontophoresis /ml Dexamethasone;Therapeutic exercise;Therapeutic activities;Patient/family education;Taping    PT Next Visit Plan  manual techniques as tolerated for muscle spasms, posture strengthening/lengthening     PT Home Exercise Plan  5/21: high rows, neutral rows (red tband), supine chin tucks + lift 10sec hold; suoccipital stretch (added 4/2(, levator stretch, UT stretch; doorway pec stretch, seated rhomboid stretch, modified child's pose L side bias    Consulted and Agree with Plan of Care  Patient       Patient will benefit from skilled therapeutic intervention in order to improve the following deficits and impairments:  Decreased mobility, Increased muscle spasms, Impaired tone, Postural dysfunction, Improper body mechanics, Pain, Increased fascial restricitons, Decreased endurance, Decreased range of motion, Decreased strength, Hypomobility, Impaired flexibility  Visit Diagnosis: Other muscle spasm     Problem List There are no active problems to display for this patient.  Staci Acosta PT, DPT  Staci Acosta 12/14/2017, 10:30 AM  [facility] PHYSICAL AND SPORTS MEDICINE [address], [place], [address] Phone: [phone]   Fax:  [phone]  Name: Teresita Fanton MRN: [id number] Date of Birth: [birth year]

## 2018-08-27 IMAGING — CT CT T SPINE W/O CM
3 of 4 series · 11 of 33 positions shown, 13 images · non-contrast
Comparison: CT abdomen pelvis 10/29/2016

CLINICAL DATA: Right-sided back pain, upper T-spine

EXAM:
CT THORACIC SPINE WITHOUT CONTRAST
TECHNIQUE: Multidetector CT images of the thoracic were obtained using the
standard protocol without intravenous contrast.

[Series 5: t spine soft (person_name) · axial · 0.31mm/px · z∈[-398,-140]mm · 5 of 187 slices shown, 7 images]
[im 29/187  soft-tissue]
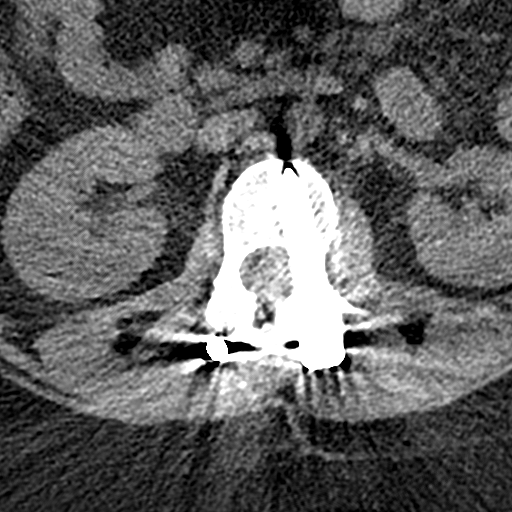
[im 29/187  bone]
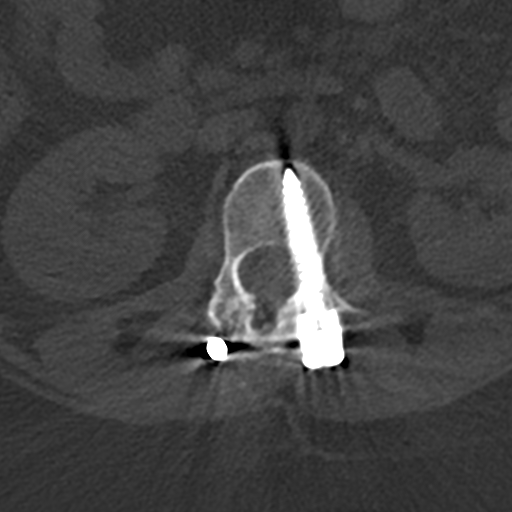
[im 58/187  bone]
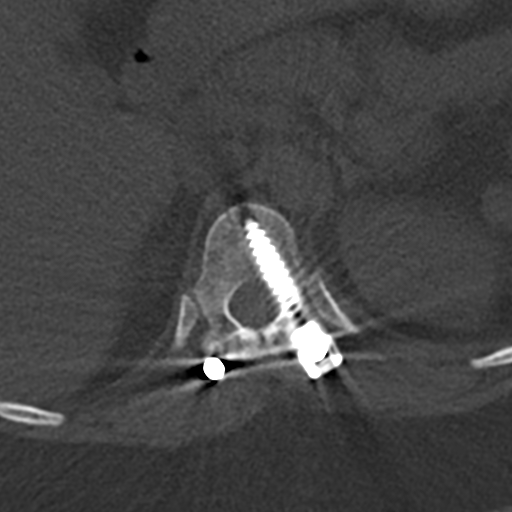
[im 101/187  bone]
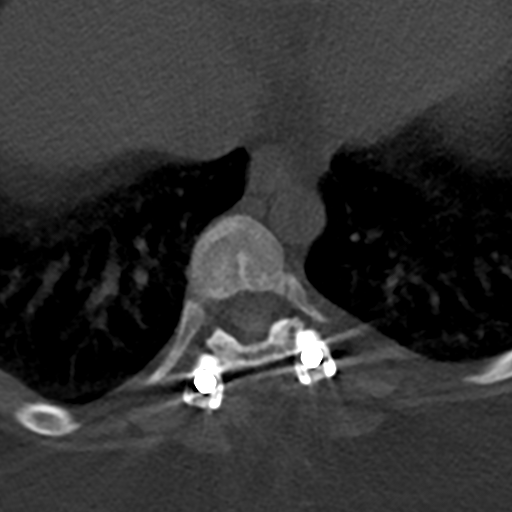
[im 129/187  bone]
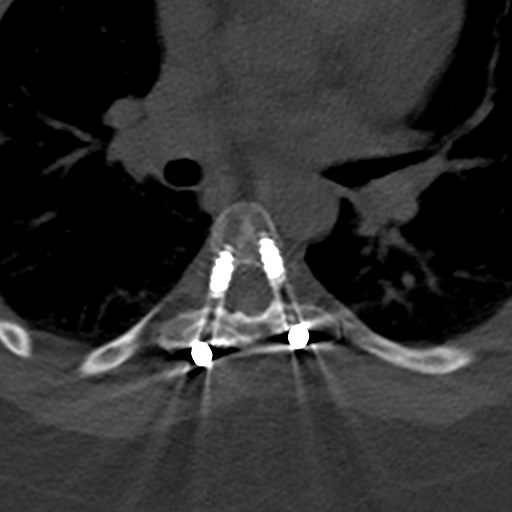
[im 158/187  soft-tissue]
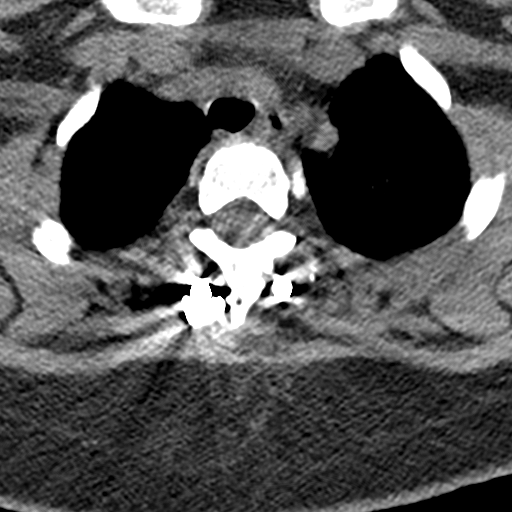
[im 158/187  bone]
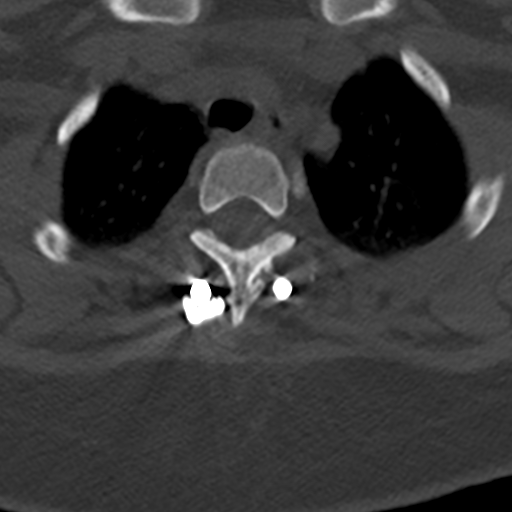

[Series 6: sagittal bone · sagittal · 0.35mm/px · 3 of 61 slices shown]
[im 16/61  bone]
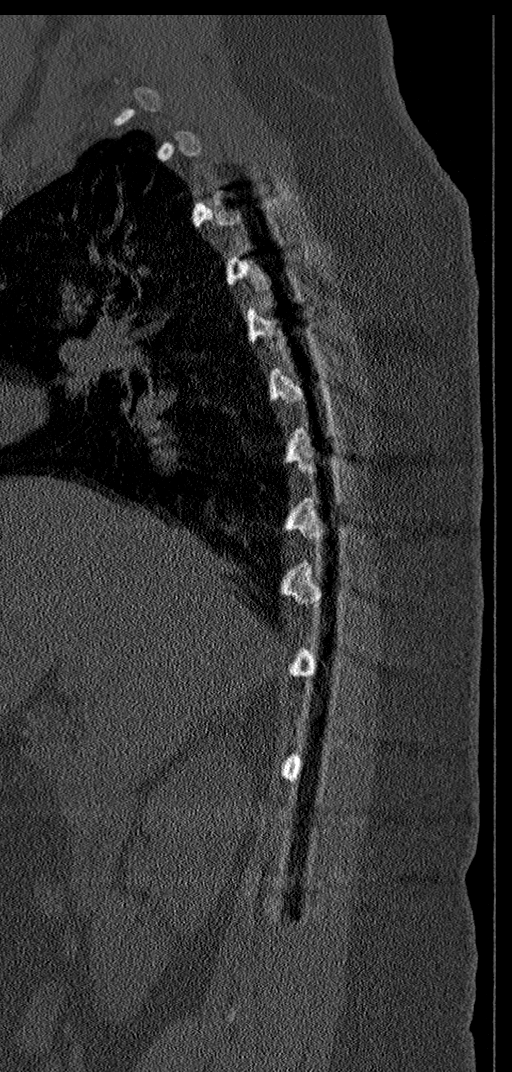
[im 31/61  bone]
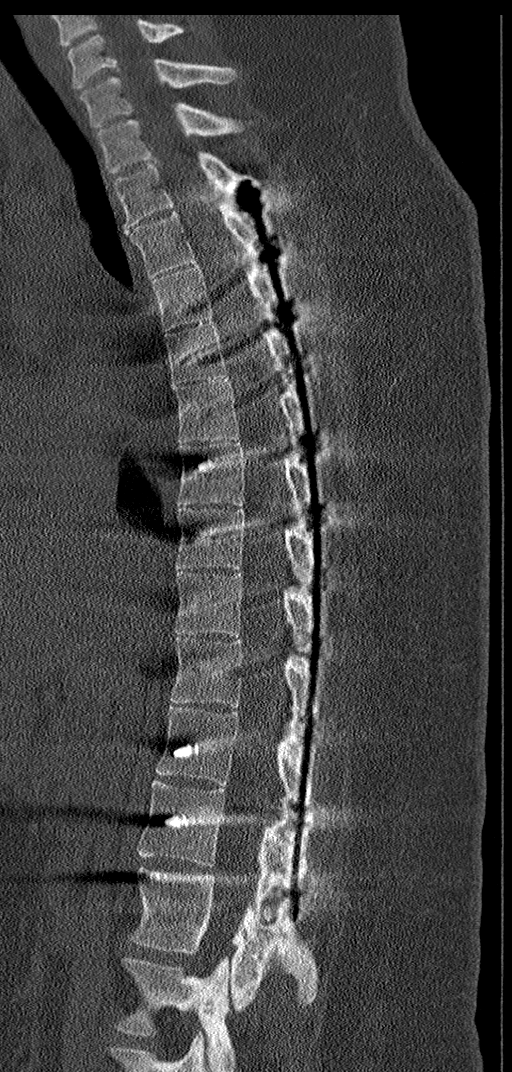
[im 46/61  bone]
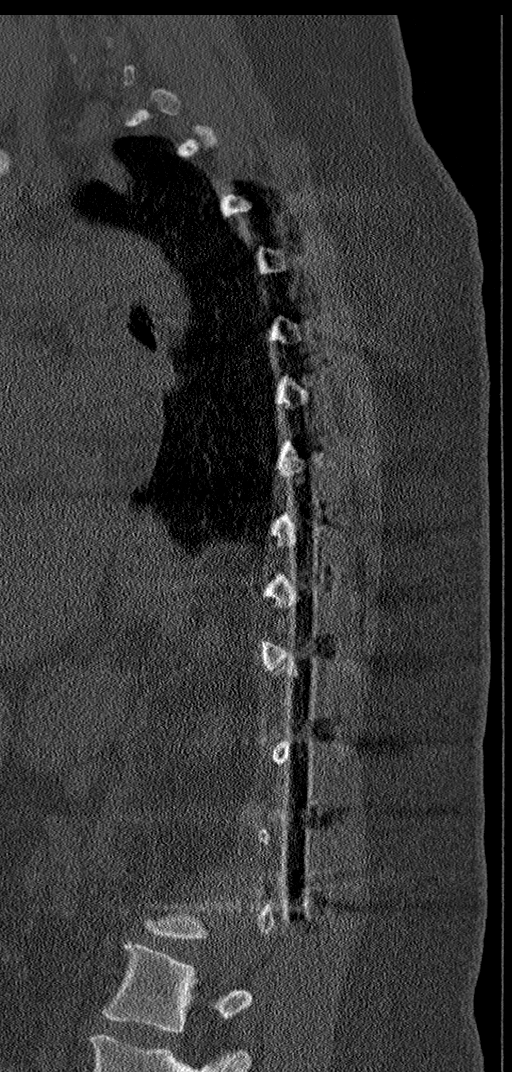

[Series 9: t spine soft coronal · coronal · 0.36mm/px · 3 of 84 slices shown]
[im 17/84  bone]
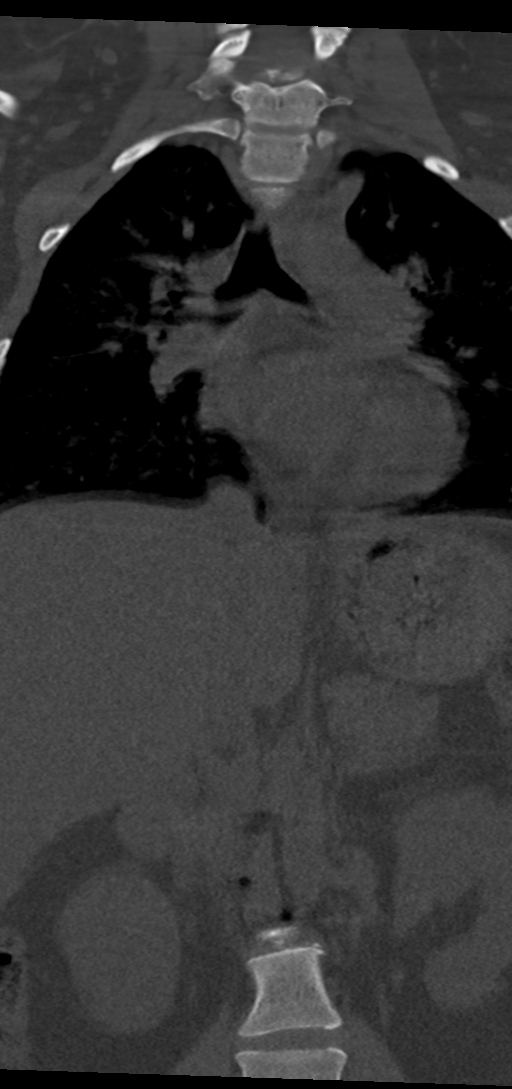
[im 34/84  bone]
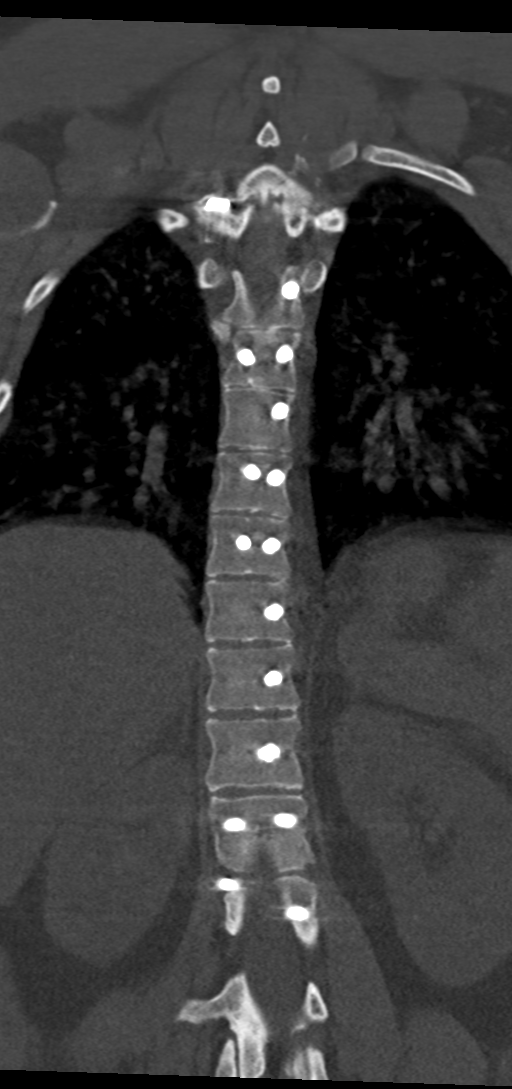
[im 50/84  bone]
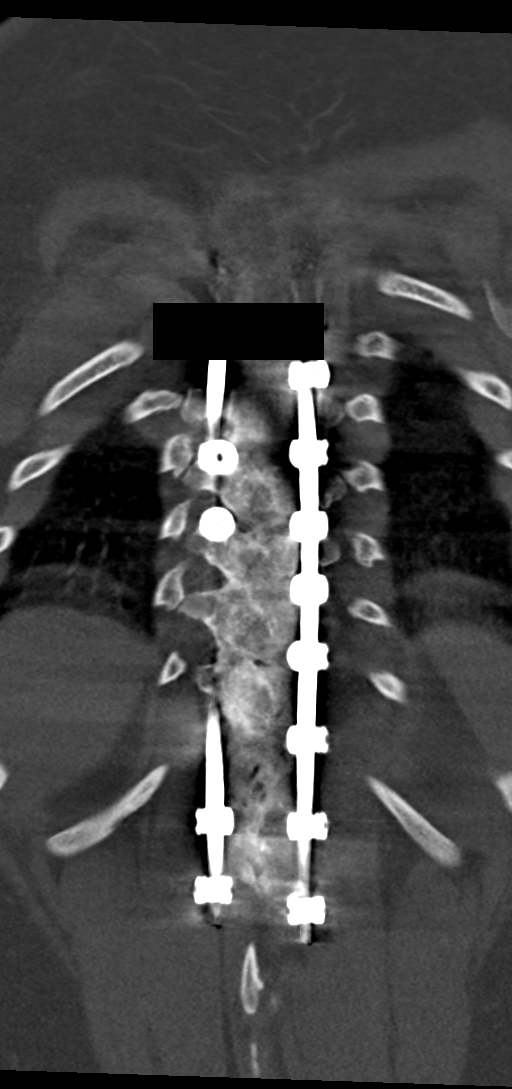

[11 of 33 positions shown; findings below may reference images not displayed]

FINDINGS: Alignment: Minimal long rightward curve of the thoracic spine.
Sagittal alignment is within normal limits.

Vertebrae: No acute fracture or focal pathologic process.

Paraspinal and other soft tissues: Negative.  Lung fields are clear.

Disc levels: Status post posterior spinal rods from T3 through L1
with multiple transpedicular screws noted. Left pedicle screw at T4
approaches/minimally breaches the left anterior cortical margin of
the vertebra with similar findings on the left at T5, T6, and T7.
Fixating screw on the right at L1 breaches the superior endplate at
L1.
IMPRESSION: 1. No acute osseous abnormality is seen
2. Status post posterior spinal rods and fixating screws from T3
through L1 with intact appearing hardware. Fixating screws on the
left at T4, T5, T6 and T7 minimally breach the anterior vertebral
cortex on the left side. Right fixating screw at L1 breaches the
superior endplate at L1.

## 2019-02-04 ENCOUNTER — Inpatient Hospital Stay: Admit: 2019-02-04 | Discharge: 2019-02-04 | Disposition: A | Discharge status: Home Health | Payer: Self-pay

## 2019-03-07 ENCOUNTER — Telehealth: Payer: Self-pay

## 2019-03-07 NOTE — Telephone Encounter (Signed)
Spoke with the patient requested to have a video appt with a stone specialist to manage her stones     Patient went to [facility] ER x 2-3 weeks ago, had a CT done with results of a 3 mm stone in left kidney and a 2 mm in right kidney.      She was not in pain at the moment.      Advised to continue monitoring s/sx, gave a video zoom appt with Dr. Posey Pronto on 03/16/2019 at 32 am.      Patient verbally authorized to obtain the medical record from [place] at [phone].     Medical record as well as CD requests were faxed to [phone] and confirmation was received.

## 2019-03-07 NOTE — Telephone Encounter (Signed)
Patient has had 7 kidney stones, she states she currently has one, currently not having symptoms, patient requesting appointment to see specialist for video or Telemed.

## 2019-03-09 NOTE — Telephone Encounter (Signed)
Medical Record from [facility] was received and handed to [place] for scanning

## 2019-03-13 ENCOUNTER — Telehealth: Payer: Self-pay | Admitting: Urology

## 2019-03-13 NOTE — Telephone Encounter (Signed)
LM with appt date, time and loc.    LM for pt to call back. MyChart status: Pending      Would like to know if pt had any recent imaging, if pt can bring CD prior to appointment.      Thank you

## 2019-03-13 NOTE — Telephone Encounter (Addendum)
Patient is returning a call to Janett Billow about her appointment on Thursday. Patient also states she is not able to make the appointment at 10:30am but she is able to do 8:30am on 03/16/2019, this Thursday. Transferred call to Reme.  Thank you

## 2019-03-13 NOTE — Telephone Encounter (Signed)
Call received and transferred to Jessica.

## 2019-03-13 NOTE — Telephone Encounter (Signed)
Spoke to pt, appt changed to 3 pm thank you

## 2019-03-15 ENCOUNTER — Other Ambulatory Visit: Payer: Self-pay | Admitting: Urology

## 2019-03-16 ENCOUNTER — Telehealth: Payer: BC Managed Care – PPO | Admitting: Urology

## 2019-03-16 ENCOUNTER — Telehealth: Payer: Self-pay | Admitting: Urology

## 2019-03-16 ENCOUNTER — Encounter: Payer: BC Managed Care – PPO | Admitting: Urology

## 2019-03-16 DIAGNOSIS — N200 Calculus of kidney: Secondary | ICD-10-CM

## 2019-03-16 MED ORDER — NORETHINDRONE ACET-ETHINYL EST 1-20 MG-MCG OR TABS
1.00 | ORAL_TABLET | ORAL | Status: AC
Start: 2018-05-10 — End: ?

## 2019-03-16 MED ORDER — TAMSULOSIN HCL 0.4 MG PO CAPS
0.40 mg | ORAL_CAPSULE | ORAL | Status: AC
Start: 2019-02-04 — End: ?

## 2019-03-16 MED ORDER — HYDROCODONE-ACETAMINOPHEN 5-325 MG OR TABS
1.00 | ORAL_TABLET | ORAL | Status: AC
Start: 2019-02-04 — End: ?

## 2019-03-16 NOTE — Telephone Encounter (Signed)
Call received and transferred to Jessica.

## 2019-03-16 NOTE — Telephone Encounter (Signed)
D/c instructions sent via mychart

## 2019-03-16 NOTE — Telephone Encounter (Signed)
Patient states she is returning [place] call from today. Transferred call to Reme for assistance. Thank you.

## 2019-03-16 NOTE — Progress Notes (Signed)
Laurie Bickham [name], MD  Director: Kidney [facility]  Assistant Clinical Professor  [place] [place], [place]  Phone: [phone]  Fax: [phone]        Department of Urology  [place] [facility] [place], [place]    Consultation    03/16/2019    History: This is a 23 year old female with a history of recurrent nephrolithiasis. Has had about 7 episodes. Has passed them all spontaneously. [facility] and found to have a left 60mm UVJ stone and a 20mm stone in the right kidney. Patient passed the left sided stone and wishes to establish care for stone prevention.     +mom with kidney stones and paternal uncle.    Denies stone inciting factors such as low PO fluid intake, high salt or high protein diet. Denies excess cola consumption.    No GU surgery.    The patient filled out a intake form today which was reviewed by me.  The past medical, surgical, social and family history as well as a 12-system review were reviewed through this, and pertinent positives are listed below.    Past Medical History:    Recurrent nephrolithiasis    Past Surgical History:  No GU surgery    Social History:   Social History     Tobacco Use   Smoking Status Not on file       Family History:  +mom and paternal uncle    Review of Systems: 12-point review of system was conducted including: systemic review, skin, head-eyes-ears-nose-throat, respiratory, cardiovascular, gastrointestinal, endocrine, neck, genitourinary, musculoskeletal, neuro-psychiatric, and hematologic. The review of systems was negative except as per HPI.     Medications:     Current Outpatient Medications:   .  HYDROcodone-acetaminophen (NORCO) 5-325 MG tablet, 1 tablet by Oral route., Disp: , Rfl:   .  norethindrone-ethinyl estradiol (MICROGESTIN 1/20) 1-20 MG-MCG tablet, 1 tablet by Oral route., Disp: , Rfl:   .  tamsulosin (FLOMAX) 0.4 MG capsule, 0.4 mg by Oral route., Disp: , Rfl:     Allergies:  Not on File    Physical  Exam:  General  appearance: The patient is well appearing,in no acute distress  HEENT: Normocephalic, atraumatic.  Chest: Breathing nonlabored.  Skin of the head and neck is normal without lesions   Extremities: no cyanosis, clubbing or edema.  Neuro: no focal deficits    Diagnostic Data:  None    Metabolic Workup:  None      Diagnostic Imaging:  CTU 02/04/2019:  Left 71mm UVJ stone and a 71mm stone in the right kidney.    Assessment:   1. Recurrent nephrolithiasis and recently passed left sided stone. Currently with right sided stone. Given number of events and age will proceed with metabolic workup.    I had a lengthy discussion with this patient regarding stone preventive measures that are conservative in nature.  We talked about drinking roughly 3-4 L per day in order to make >2.5L of urine per day, limited salt intake to 2000mg  sodium per day, minimizing phosphoric acid containing sodas (colas), potential benefit of a fish oil supplement, increasing citric acid containing fruits or beverages such as lemon juice/Manter juice, minimizing animal protein < 23meals per day and less than 6-8 ounces, limiting the intake of high oxalate foods such as nuts, < 1000 mg Vitamin C, normal calcium intake and moderate exercise and weight reduction.      Plan:   1.  Stone analysis  2. Metabolic workup with stone labs and 24-hour urine x 2  3. Follow up in 6 weeks        At this conclusion of this encounter all of the patient's (and family) questions were answered to their satisfaction, they were encouraged to contact our office at any time if there were any further questions or issues, and we will follow up as described above.    Due to COVID-19 pandemic and a federally declared state of public health emergency, this telemedicine visit was conducted Audio+Video.   Patient confirmed their presence in the state of [place] and has consented to proceeding with telemedicine today. Total time spent was 21+ minutes.       Arrielle Mcginn M.  Allena KatzPatel, MD    Patient Questionnaire Responses on 03/16/2019   Are you in state [place] Yes  Do you consent to proceed with the Video Visit today?: Yes

## 2019-04-11 ENCOUNTER — Other Ambulatory Visit: Payer: Self-pay | Admitting: Urology

## 2019-04-12 LAB — PTH, INTACT AND CALCIUM
Calcium: 9.7 mg/dL (ref 8.6–10.2)
Parathyroid Hormone, Intact: 25 pg/mL (ref 14–64)

## 2019-04-12 LAB — BASIC METABOLIC PANEL, BLOOD
BUN: 10 mg/dL (ref 7–25)
Calcium: 9.7 mg/dL (ref 8.6–10.2)
Carbon Dioxide: 29 mmol/L (ref 20–32)
Chloride: 102 mmol/L (ref 98–110)
Creatinine: 0.74 mg/dL (ref 0.50–1.10)
Glucose: 113 mg/dL (ref 65–139)
Potassium: 4.4 mmol/L (ref 3.5–5.3)
Sodium: 138 mmol/L (ref 135–146)
eGFR African American: 132 mL/min/{1.73_m2} (ref >=60)
eGFR non-Afr.American: 114 mL/min/{1.73_m2} (ref >=60)

## 2019-04-12 LAB — CBC WITH DIFF, BLOOD
Abs Basophils: 19 cells/uL (ref 0–200)
Abs Eosinophils: 48 cells/uL (ref 15–500)
Abs Lymphs: 2640 cells/uL (ref 850–3900)
Abs Monocytes: 634 cells/uL (ref 200–950)
Abs NRBC: 0 cells/uL
Abs Neutrophils: 6259 cells/uL (ref 1500–7800)
Basophils: 0.2 %
Eosinophils: 0.5 %
HCT: 39.9 % (ref 35.0–45.0)
HGB: 12.8 g/dL (ref 11.7–15.5)
Lymps: 27.5 %
MCH: 26.2 pg — ABNORMAL LOW (ref 27.0–33.0)
MCHC: 32.1 g/dL (ref 32.0–36.0)
MCV: 81.6 fL (ref 80.0–100.0)
MPV: 11.4 fL (ref 7.5–12.5)
Monocytes: 6.6 %
PLT: 407 10*3/uL — ABNORMAL HIGH (ref 140–400)
RBC: 4.89 10*6/uL (ref 3.80–5.10)
RDW: 14.2 % (ref 11.0–15.0)
SEGS: 65.2 %
WBC: 9.6 10*3/uL (ref 3.8–10.8)

## 2019-04-12 LAB — URINALYSIS WITH CULTURE REFLEX, WHEN INDICATED
Bacteria: NONE SEEN /HPF
Bilrubin: NEGATIVE
Blood: NEGATIVE
Glucose: NEGATIVE
Hyaline Casts: NONE SEEN /LPF
Ketones: NEGATIVE
Leukocyte Esterase: NEGATIVE
Nitrite: NEGATIVE
Protein, Urine: NEGATIVE
Specific Gravity: 1.019 (ref 1.001–1.035)
pH: 8 (ref 5.0–8.0)

## 2019-04-12 LAB — URIC ACID, BLOOD: Uric Acid: 5 mg/dL (ref 2.5–7.0)

## 2019-04-12 LAB — CALCIUM, IONIZED BLOOD: Calcium, Ionized: 5.1 mg/dL (ref 4.8–5.6)

## 2019-04-17 ENCOUNTER — Encounter: Payer: Self-pay | Admitting: Urology

## 2019-04-20 NOTE — Telephone Encounter (Signed)
Reference phone note for rescheduling.

## 2019-04-20 NOTE — Telephone Encounter (Signed)
Called and left message for call back.  Will reschedule follow up based on patient availability.

## 2019-04-20 NOTE — Telephone Encounter (Signed)
From: Daneen Schick  To: Claudean Severance, MD  Sent: 04/17/2019 12:11 PM PDT  Subject: 1-Non Urgent Medical Advice    Hi,    Is there a way to reschedule my appointment to the end of October?     Thanks,  Daneen Schick

## 2019-04-27 ENCOUNTER — Encounter: Payer: BC Managed Care – PPO | Admitting: Urology

## 2019-05-22 ENCOUNTER — Telehealth: Payer: Self-pay | Admitting: Urology

## 2019-05-22 NOTE — Telephone Encounter (Signed)
LM for pt to call back to confirm appt. Would like to know if pt is interested in coming earlier.    Thank you

## 2019-05-25 ENCOUNTER — Encounter: Payer: BC Managed Care – PPO | Admitting: Urology

## 2019-05-31 ENCOUNTER — Ambulatory Visit: Payer: Self-pay | Admitting: Urology

## 2019-05-31 NOTE — Telephone Encounter (Signed)
Hello Dr. Posey Pronto,     [place] sharp pain in RUQ radiating to right flank about 8-9 since last night. Took Hydrocodone x 30 mins ago.  (+) nausea, hematuria.  Advised to go to a local ER because patient was an hour away from [place].

## 2019-05-31 NOTE — Telephone Encounter (Signed)
Reason for Disposition  . [1] SEVERE pain (e.g., excruciating) AND [2] present > 1 hour    Additional Information  . Negative: Severe difficulty breathing (e.g., struggling for each breath, speaks in single words)  . Negative: Shock suspected (e.g., cold/pale/clammy skin, too weak to stand, low BP, rapid pulse)  . Negative: Difficult to awaken or acting confused (e.g., disoriented, slurred speech)  . Negative: Passed out (i.e., lost consciousness, collapsed and was not responding)  . Negative: Visible sweat on face or sweat dripping down face  . Negative: Sounds like a life-threatening emergency to the triager  . Negative: Followed an abdomen (stomach) injury  . Negative: Chest pain    Answer Assessment - Initial Assessment Questions  1. LOCATION: "Where does it hurt?"       Right upper quadrants   2. RADIATION: "Does the pain shoot anywhere else?" (e.g., chest, back)      Radiating to right flank   3. ONSET: "When did the pain begin?" (e.g., minutes, hours or days ago)       Last night   4. SUDDEN: "Gradual or sudden onset?"      suddent  5. PATTERN "Does the pain come and go, or is it constant?"     - If constant: "Is it getting better, staying the same, or worsening?"       (Note: Constant means the pain never goes away completely; most serious pain is constant and it progresses)      - If intermittent: "How long does it last?" "Do you have pain now?"      (Note: Intermittent means the pain goes away completely between bouts)      Constant   6. SEVERITY: "How bad is the pain?"  (e.g., Scale 1-10; mild, moderate, or severe)     - MILD (1-3): doesn't interfere with normal activities, abdomen soft and not tender to touch      - MODERATE (4-7): interferes with normal activities or awakens from sleep, tender to touch      - SEVERE (8-10): excruciating pain, doubled over, unable to do any normal activities        8-9   7. RECURRENT SYMPTOM: "Have you ever had this type of abdominal pain before?" If so, ask: "When was  the last time?" and "What happened that time?"       NO   8. AGGRAVATING FACTORS: "Does anything seem to cause this pain?" (e.g., foods, stress, alcohol)      NO   9. CARDIAC SYMPTOMS: "Do you have any of the following symptoms: chest pain, difficulty breathing, sweating, nausea?"      No   10. OTHER SYMPTOMS: "Do you have any other symptoms?" (e.g., fever, vomiting, diarrhea)        Nausea.   11. PREGNANCY: "Is there any chance you are pregnant?" "When was your last menstrual period?"        None    Protocols used: ABDOMINAL PAIN - UPPER-A-AH

## 2019-05-31 NOTE — Telephone Encounter (Signed)
Patient called and stated she thinks she might have a kidney stone because she is in some serious pain. Patient is unsure whether she should go to the ER. Called Kidney stone line, no answer.     Please call back, thank you.

## 2019-05-31 NOTE — Telephone Encounter (Signed)
PROVIDER ACTION REQUESTED: NO, FYI Only  RN ACTION: Advice given  Reason for Call: Message For MD/RN     Disposition: Go to ED Now

## 2019-06-02 ENCOUNTER — Encounter: Payer: Self-pay | Admitting: Urology

## 2019-06-02 ENCOUNTER — Telehealth: Payer: Self-pay | Admitting: Urology

## 2019-06-02 DIAGNOSIS — N200 Calculus of kidney: Secondary | ICD-10-CM

## 2019-06-02 NOTE — Telephone Encounter (Signed)
Patient is requesting to speak with Laurie Chung as well as the doctor to update them on what happened when she went to the emergency room. They let her know she has a kidney stone as well as a gallstone.    Please contact and further assist  Thank you

## 2019-06-02 NOTE — Telephone Encounter (Signed)
Patient is returning a call to [facility]. Called the [employer] and left a voicemail.     Please contact and further assist  Thank you

## 2019-06-02 NOTE — Telephone Encounter (Signed)
Called the patient again but no answer.  Left the voice message to call back.

## 2019-06-02 NOTE — Telephone Encounter (Signed)
Called the patient but no answer.  Left VM to call back.     Note  [place] was done on 05/31/2019:     1. Moderate to severe hepatic steatosis.   2. Cholelithiasis.   3. Splenomegaly.   4. Right nephrolithiasis.   5. Pancreas obscured by overlying bowel gas.

## 2019-06-06 NOTE — Telephone Encounter (Signed)
Called the patient at [phone] but no answer.  Left the detailed message for the patient to call back.

## 2019-06-10 LAB — STONE ANALYSIS - LABCORP
Calcium Oxalate Dihydrate - LabCorp: 80 %
Calcium Oxalate Monohydrate: 20 %
Weight - LabCorp: 11 mg

## 2019-06-10 LAB — SPECIMEN STATUS REPORT-LABCORP

## 2019-06-15 ENCOUNTER — Telehealth: Payer: Self-pay | Admitting: Urology

## 2019-06-15 ENCOUNTER — Encounter: Payer: BC Managed Care – PPO | Admitting: Urology

## 2019-06-15 NOTE — Telephone Encounter (Signed)
LVM for patient to return my call to reschedule her appt with Dr. Posey Pronto as requested

## 2019-06-15 NOTE — Telephone Encounter (Signed)
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 Her appointment was at 11:15 and is 12:02. She states she will call back to reschedule.   Thank you

## 2019-06-15 NOTE — Progress Notes (Signed)
Visit to be rescheduled.    Laurie Farnell [name], MD    This encounter was opened in error.  Please disregard.

## 2019-06-16 NOTE — Telephone Encounter (Signed)
LVM for patient to call our office to R/S missed appt from yesterday.

## 2019-06-22 ENCOUNTER — Other Ambulatory Visit: Payer: Self-pay

## 2019-06-28 ENCOUNTER — Other Ambulatory Visit: Payer: Self-pay

## 2020-01-22 ENCOUNTER — Encounter: Payer: Self-pay | Admitting: Urology

## 2020-01-24 NOTE — Telephone Encounter (Signed)
From: Alinda Deem  To: Georga Hacking, MD  Sent: 01/22/2020 4:08 PM PDT  Subject: 7-Results    Hi,    My urologist, Dr. Salley Slaughter is requesting my metabolic evaluation. Given I did the blood work and 48 hour urine collection through your office, I am requesting through here. Please send the information to him.
